# Patient Record
Sex: Female | Born: 1955 | Race: White | Hispanic: No | Marital: Married | Smoking: Never smoker
Health system: Southern US, Community
[De-identification: ages and names within clinical notes are randomized; demographics above are authoritative.]

## PROBLEM LIST (undated history)

## (undated) DIAGNOSIS — R4586 Emotional lability: Secondary | ICD-10-CM

## (undated) DIAGNOSIS — E119 Type 2 diabetes mellitus without complications: Secondary | ICD-10-CM

## (undated) DIAGNOSIS — D649 Anemia, unspecified: Secondary | ICD-10-CM

## (undated) DIAGNOSIS — F329 Major depressive disorder, single episode, unspecified: Secondary | ICD-10-CM

## (undated) DIAGNOSIS — E7800 Pure hypercholesterolemia, unspecified: Secondary | ICD-10-CM

## (undated) DIAGNOSIS — E039 Hypothyroidism, unspecified: Secondary | ICD-10-CM

## (undated) DIAGNOSIS — M1990 Unspecified osteoarthritis, unspecified site: Secondary | ICD-10-CM

## (undated) DIAGNOSIS — M8580 Other specified disorders of bone density and structure, unspecified site: Secondary | ICD-10-CM

## (undated) DIAGNOSIS — F32A Depression, unspecified: Secondary | ICD-10-CM

## (undated) DIAGNOSIS — Z9884 Bariatric surgery status: Secondary | ICD-10-CM

## (undated) HISTORY — PX: HIP FRACTURE SURGERY: SHX118

## (undated) HISTORY — DX: Depression, unspecified: F32.A

## (undated) HISTORY — DX: Bariatric surgery status: Z98.84

## (undated) HISTORY — DX: Pure hypercholesterolemia, unspecified: E78.00

## (undated) HISTORY — DX: Type 2 diabetes mellitus without complications: E11.9

## (undated) HISTORY — DX: Other specified disorders of bone density and structure, unspecified site: M85.80

## (undated) HISTORY — PX: KNEE ARTHROSCOPY WITH ANTERIOR CRUCIATE LIGAMENT (ACL) REPAIR: SHX5644

## (undated) HISTORY — DX: Major depressive disorder, single episode, unspecified: F32.9

## (undated) HISTORY — PX: CHOLECYSTECTOMY: SHX55

## (undated) HISTORY — PX: COLONOSCOPY: SHX174

## (undated) HISTORY — PX: SHOULDER ARTHROSCOPY WITH SUBACROMIAL DECOMPRESSION AND OPEN ROTATOR C: SHX5688

---

## 1998-09-02 ENCOUNTER — Encounter: Payer: Self-pay | Admitting: Orthopedic Surgery

## 1998-09-02 ENCOUNTER — Inpatient Hospital Stay (HOSPITAL_COMMUNITY): Admission: EM | Admit: 1998-09-02 | Discharge: 1998-09-05 | Payer: Self-pay | Admitting: Orthopedic Surgery

## 2000-01-03 ENCOUNTER — Emergency Department (HOSPITAL_COMMUNITY): Admission: EM | Admit: 2000-01-03 | Discharge: 2000-01-03 | Payer: Self-pay | Admitting: Emergency Medicine

## 2000-07-20 HISTORY — PX: GASTRIC BYPASS: SHX52

## 2003-04-20 ENCOUNTER — Encounter: Admission: RE | Admit: 2003-04-20 | Discharge: 2003-04-20 | Payer: Self-pay | Admitting: Family Medicine

## 2003-04-20 ENCOUNTER — Encounter: Payer: Self-pay | Admitting: Family Medicine

## 2003-05-02 ENCOUNTER — Other Ambulatory Visit: Admission: RE | Admit: 2003-05-02 | Discharge: 2003-05-02 | Payer: Self-pay | Admitting: Family Medicine

## 2006-07-20 HISTORY — PX: HAMMER TOE SURGERY: SHX385

## 2006-07-20 HISTORY — PX: OTHER SURGICAL HISTORY: SHX169

## 2006-11-22 ENCOUNTER — Other Ambulatory Visit: Admission: RE | Admit: 2006-11-22 | Discharge: 2006-11-22 | Payer: Self-pay | Admitting: Family Medicine

## 2008-09-20 ENCOUNTER — Other Ambulatory Visit: Admission: RE | Admit: 2008-09-20 | Discharge: 2008-09-20 | Payer: Self-pay | Admitting: Family Medicine

## 2009-07-20 HISTORY — PX: LAPAROSCOPIC GASTRIC BANDING: SHX1100

## 2010-01-21 ENCOUNTER — Other Ambulatory Visit: Admission: RE | Admit: 2010-01-21 | Discharge: 2010-01-21 | Payer: Self-pay | Admitting: Family Medicine

## 2010-07-20 HISTORY — PX: OTHER SURGICAL HISTORY: SHX169

## 2011-09-24 ENCOUNTER — Other Ambulatory Visit: Payer: Self-pay | Admitting: Family Medicine

## 2011-09-24 DIAGNOSIS — G44009 Cluster headache syndrome, unspecified, not intractable: Secondary | ICD-10-CM

## 2011-09-28 ENCOUNTER — Other Ambulatory Visit: Payer: Self-pay | Admitting: Family Medicine

## 2011-09-28 ENCOUNTER — Ambulatory Visit
Admission: RE | Admit: 2011-09-28 | Discharge: 2011-09-28 | Disposition: A | Discharge status: Home / Self Care | Payer: 59 | Source: Ambulatory Visit | Attending: Family Medicine | Admitting: Family Medicine

## 2011-09-28 ENCOUNTER — Inpatient Hospital Stay: Admission: RE | Admit: 2011-09-28 | Payer: 59 | Source: Ambulatory Visit

## 2011-09-28 DIAGNOSIS — G44009 Cluster headache syndrome, unspecified, not intractable: Secondary | ICD-10-CM

## 2011-09-28 MED ORDER — GADOBENATE DIMEGLUMINE 529 MG/ML IV SOLN
15.0000 mL | Freq: Once | INTRAVENOUS | Status: AC | PRN
  Administered 2011-09-28: 15 mL via INTRAVENOUS

## 2013-08-07 ENCOUNTER — Other Ambulatory Visit (HOSPITAL_COMMUNITY)
Admission: RE | Admit: 2013-08-07 | Discharge: 2013-08-07 | Disposition: A | Discharge status: Home / Self Care | Payer: BC Managed Care – PPO | Source: Ambulatory Visit | Attending: Family Medicine | Admitting: Family Medicine

## 2013-08-07 ENCOUNTER — Other Ambulatory Visit: Payer: Self-pay | Admitting: Family Medicine

## 2013-08-07 DIAGNOSIS — Z124 Encounter for screening for malignant neoplasm of cervix: Secondary | ICD-10-CM | POA: Insufficient documentation

## 2013-08-07 DIAGNOSIS — Z1151 Encounter for screening for human papillomavirus (HPV): Secondary | ICD-10-CM | POA: Insufficient documentation

## 2015-06-07 ENCOUNTER — Ambulatory Visit: Payer: Self-pay | Admitting: Physician Assistant

## 2015-06-07 NOTE — H&P (Signed)
TOTAL HIP ADMISSION H&P  Patient is admitted for right total hip arthroplasty.  Subjective:  Chief Complaint: right hip pain  HPI: Renee BogaSherry Guisinger, 59 y.o. female, has a history of pain and functional disability in the right hip(s) due to trauma and patient has failed non-surgical conservative treatments for greater than 12 weeks to include NSAID's and/or analgesics, corticosteriod injections, use of assistive devices and activity modification.  Onset of symptoms was gradual starting 5 years ago with gradually worsening course since that time.The patient noted prior procedures of the hip to include hip fracture canullated pinning on the right hip(s).  Patient currently rates pain in the right hip at 10 out of 10 with activity. Patient has night pain, worsening of pain with activity and weight bearing, trendelenberg gait, pain that interfers with activities of daily living, pain with passive range of motion, crepitus and joint swelling. Patient has evidence of joint space narrowing by imaging studies. This condition presents safety issues increasing the risk of falls.  There is no current active infection.  There are no active problems to display for this patient.  No past medical history on file.  No past surgical history on file.   (Not in a hospital admission) Allergies not on file  Social History  Substance Use Topics  . Smoking status: Not on file  . Smokeless tobacco: Not on file  . Alcohol Use: Not on file    No family history on file.   Review of Systems  Musculoskeletal: Positive for joint pain.  All other systems reviewed and are negative.   Objective:  Physical Exam  Constitutional: She is oriented to person, place, and time. She appears well-developed and well-nourished. No distress.  HENT:  Head: Normocephalic and atraumatic.  Nose: Nose normal.  Eyes: Conjunctivae and EOM are normal. Pupils are equal, round, and reactive to light.  Neck: Normal range of motion. Neck  supple.  Cardiovascular: Normal rate, regular rhythm, normal heart sounds and intact distal pulses.   Respiratory: Effort normal and breath sounds normal. No respiratory distress. She has no wheezes.  GI: Soft. Bowel sounds are normal. She exhibits no distension. There is no tenderness.  Musculoskeletal:       Right hip: She exhibits decreased range of motion, tenderness and bony tenderness.  Lymphadenopathy:    She has no cervical adenopathy.  Neurological: She is alert and oriented to person, place, and time. No cranial nerve deficit.  Skin: Skin is warm and dry. No rash noted. No erythema.  Psychiatric: She has a normal mood and affect. Her behavior is normal.    Vital signs in last 24 hours: @VSRANGES @  Labs:   There is no height or weight on file to calculate BMI.   Imaging Review Plain radiographs demonstrate severe degenerative joint disease of the right hip(s). The bone quality appears to be good for age and reported activity level.  Three canulated screws across femoral neck.  Assessment/Plan:  End stage arthritis, right hip(s)  The patient history, physical examination, clinical judgement of the provider and imaging studies are consistent with end stage degenerative joint disease of the right hip(s) and total hip arthroplasty is deemed medically necessary. The treatment options including medical management, injection therapy, arthroscopy and arthroplasty were discussed at length. The risks and benefits of total hip arthroplasty were presented and reviewed. The risks due to aseptic loosening, infection, stiffness, dislocation/subluxation,  thromboembolic complications and other imponderables were discussed.  The patient acknowledged the explanation, agreed to proceed with the plan  and consent was signed. Patient is being admitted for inpatient treatment for surgery, pain control, PT, OT, prophylactic antibiotics, VTE prophylaxis, progressive ambulation and ADL's and discharge  planning.The patient is planning to be discharged home with home health services

## 2015-06-11 ENCOUNTER — Encounter (HOSPITAL_COMMUNITY): Payer: Self-pay

## 2015-06-11 ENCOUNTER — Encounter (HOSPITAL_COMMUNITY): Payer: Self-pay | Admitting: Emergency Medicine

## 2015-06-11 ENCOUNTER — Ambulatory Visit: Payer: Self-pay | Admitting: Physician Assistant

## 2015-06-11 ENCOUNTER — Encounter (HOSPITAL_COMMUNITY)
Admission: RE | Admit: 2015-06-11 | Discharge: 2015-06-11 | Disposition: A | Discharge status: Home / Self Care | Payer: 59 | Source: Ambulatory Visit | Attending: Orthopedic Surgery | Admitting: Orthopedic Surgery

## 2015-06-11 ENCOUNTER — Ambulatory Visit (HOSPITAL_COMMUNITY): Admission: RE | Admit: 2015-06-11 | Payer: 59 | Source: Ambulatory Visit

## 2015-06-11 ENCOUNTER — Ambulatory Visit (HOSPITAL_COMMUNITY)
Admission: RE | Admit: 2015-06-11 | Discharge: 2015-06-11 | Disposition: A | Discharge status: Home / Self Care | Payer: 59 | Source: Ambulatory Visit | Attending: Physician Assistant | Admitting: Physician Assistant

## 2015-06-11 DIAGNOSIS — Z01818 Encounter for other preprocedural examination: Secondary | ICD-10-CM | POA: Insufficient documentation

## 2015-06-11 DIAGNOSIS — M1611 Unilateral primary osteoarthritis, right hip: Secondary | ICD-10-CM

## 2015-06-11 DIAGNOSIS — Z01810 Encounter for preprocedural cardiovascular examination: Secondary | ICD-10-CM | POA: Insufficient documentation

## 2015-06-11 DIAGNOSIS — Z01812 Encounter for preprocedural laboratory examination: Secondary | ICD-10-CM | POA: Diagnosis not present

## 2015-06-11 HISTORY — DX: Unspecified osteoarthritis, unspecified site: M19.90

## 2015-06-11 HISTORY — DX: Emotional lability: R45.86

## 2015-06-11 HISTORY — DX: Hypothyroidism, unspecified: E03.9

## 2015-06-11 HISTORY — DX: Anemia, unspecified: D64.9

## 2015-06-11 LAB — CBC WITH DIFFERENTIAL/PLATELET
BASOS ABS: 0 10*3/uL (ref 0.0–0.1)
Basophils Relative: 0 %
Eosinophils Absolute: 0.1 10*3/uL (ref 0.0–0.7)
Eosinophils Relative: 1 %
HEMATOCRIT: 29.2 % — AB (ref 36.0–46.0)
HEMOGLOBIN: 7.9 g/dL — AB (ref 12.0–15.0)
LYMPHS PCT: 21 %
Lymphs Abs: 2.2 10*3/uL (ref 0.7–4.0)
MCH: 19 pg — ABNORMAL LOW (ref 26.0–34.0)
MCHC: 27.1 g/dL — ABNORMAL LOW (ref 30.0–36.0)
MCV: 70.4 fL — ABNORMAL LOW (ref 78.0–100.0)
MONOS PCT: 5 %
Monocytes Absolute: 0.5 10*3/uL (ref 0.1–1.0)
Neutro Abs: 7.6 10*3/uL (ref 1.7–7.7)
Neutrophils Relative %: 73 %
Platelets: 482 10*3/uL — ABNORMAL HIGH (ref 150–400)
RBC: 4.15 MIL/uL (ref 3.87–5.11)
RDW: 19.1 % — ABNORMAL HIGH (ref 11.5–15.5)
WBC: 10.4 10*3/uL (ref 4.0–10.5)

## 2015-06-11 LAB — URINALYSIS, ROUTINE W REFLEX MICROSCOPIC
BILIRUBIN URINE: NEGATIVE
Glucose, UA: NEGATIVE mg/dL
Hgb urine dipstick: NEGATIVE
KETONES UR: NEGATIVE mg/dL
NITRITE: NEGATIVE
PROTEIN: NEGATIVE mg/dL
SPECIFIC GRAVITY, URINE: 1.016 (ref 1.005–1.030)
pH: 5.5 (ref 5.0–8.0)

## 2015-06-11 LAB — COMPREHENSIVE METABOLIC PANEL
ALBUMIN: 3.3 g/dL — AB (ref 3.5–5.0)
ALK PHOS: 94 U/L (ref 38–126)
ALT: 21 U/L (ref 14–54)
AST: 27 U/L (ref 15–41)
Anion gap: 5 (ref 5–15)
BILIRUBIN TOTAL: 0.5 mg/dL (ref 0.3–1.2)
CALCIUM: 8.7 mg/dL — AB (ref 8.9–10.3)
CO2: 27 mmol/L (ref 22–32)
Chloride: 108 mmol/L (ref 101–111)
Creatinine, Ser: 0.82 mg/dL (ref 0.44–1.00)
GFR calc Af Amer: >60 mL/min (ref >60)
GFR calc non Af Amer: >60 mL/min (ref >60)
GLUCOSE: 92 mg/dL (ref 65–99)
POTASSIUM: 3.3 mmol/L — AB (ref 3.5–5.1)
Sodium: 140 mmol/L (ref 135–145)
TOTAL PROTEIN: 6.2 g/dL — AB (ref 6.5–8.1)

## 2015-06-11 LAB — TYPE AND SCREEN
ABO/RH(D): O POS
ANTIBODY SCREEN: NEGATIVE

## 2015-06-11 LAB — ABO/RH: ABO/RH(D): O POS

## 2015-06-11 LAB — URINE MICROSCOPIC-ADD ON

## 2015-06-11 LAB — SURGICAL PCR SCREEN
MRSA, PCR: NEGATIVE
STAPHYLOCOCCUS AUREUS: NEGATIVE

## 2015-06-11 LAB — PROTIME-INR
INR: 1.04 (ref 0.00–1.49)
Prothrombin Time: 13.8 seconds (ref 11.6–15.2)

## 2015-06-11 LAB — APTT: APTT: 32 s (ref 24–37)

## 2015-06-11 NOTE — Pre-Procedure Instructions (Addendum)
Renee [name]  06/11/2015     No Pharmacies Listed   Your procedure is scheduled on 06/21/15  [facility] at 530 A.M.  Call this number if you have problems the morning of surgery:  [phone]   Remember:  Do not eat food or drink liquids after midnight.  Take these medicines the morning of surgery with A SIP OF WATER nexium,paxil,tramadol if needed    STOP all herbel meds, nsaids (aleve,naproxen,advil,ibuprofen) 5 days prior to surgery starting 11.27.16 including all vitamins, aspirin   Do not wear jewelry, make-up or nail polish.  Do not wear lotions, powders, or perfumes.  You may wear deodorant.  Do not shave 48 hours prior to surgery.  Men may shave face and neck.  Do not bring valuables to the hospital.  [facility] is not responsible for any belongings or valuables.  Contacts, dentures or bridgework may not be worn into surgery.  Leave your suitcase in the car.  After surgery it may be brought to your room.  For patients admitted to the hospital, discharge time will be determined by your treatment team.  Patients discharged the day of surgery will not be allowed to drive home.   Name and phone number of your driver:    Special instructions:   Special Instructions: [facility] - Preparing for Surgery  Before surgery, you can play an important role.  Because skin is not sterile, your skin needs to be as free of germs as possible.  You can reduce the number of germs on you skin by washing with CHG (chlorahexidine gluconate) soap before surgery.  CHG is an antiseptic cleaner which kills germs and bonds with the skin to continue killing germs even after washing.  Please DO NOT use if you have an allergy to CHG or antibacterial soaps.  If your skin becomes reddened/irritated stop using the CHG and inform your nurse when you arrive at Short Stay.  Do not shave (including legs and underarms) for at least 48 hours prior to the first CHG shower.   You may shave your face.  Please follow these instructions carefully:   1.  Shower with CHG Soap the night before surgery and the morning of Surgery.  2.  If you choose to wash your hair, wash your hair first as usual with your normal shampoo.  3.  After you shampoo, rinse your hair and body thoroughly to remove the Shampoo.  4.  Use CHG as you would any other liquid soap.  You can apply chg directly  to the skin and wash gently with scrungie or a clean washcloth.  5.  Apply the CHG Soap to your body ONLY FROM THE NECK DOWN.  Do not use on open wounds or open sores.  Avoid contact with your eyes ears, mouth and genitals (private parts).  Wash genitals (private parts)       with your normal soap.  6.  Wash thoroughly, paying special attention to the area where your surgery will be performed.  7.  Thoroughly rinse your body with warm water from the neck down.  8.  DO NOT shower/wash with your normal soap after using and rinsing off the CHG Soap.  9.  Pat yourself dry with a clean towel.            10.  Wear clean pajamas.            11.  Place clean sheets on your bed the  night of your first shower and do not sleep with pets.  Day of Surgery  Do not apply any lotions/deodorants the morning of surgery.  Please wear clean clothes to the hospital/surgery center.  Please read over the following fact sheets that you were given. Pain Booklet, Coughing and Deep Breathing, Blood Transfusion Information, Total Joint Packet, MRSA Information and Surgical Site Infection Prevention

## 2015-06-11 NOTE — Progress Notes (Signed)
Dr caffrey office clled with lab results   hgh 7.9, hct 29.2, k+ 3.3. To review and follow up per kelly rn/josh pa

## 2015-06-12 LAB — URINE CULTURE

## 2015-06-19 ENCOUNTER — Telehealth: Payer: Self-pay | Admitting: Hematology

## 2015-06-19 NOTE — Telephone Encounter (Signed)
S/W PT IN REF TO NP APPT ON 06/27/15 @1 :30 REFERRING DR LITTLE DX-ANEMIA

## 2015-06-21 ENCOUNTER — Encounter (HOSPITAL_COMMUNITY): Admission: RE | Payer: Self-pay | Source: Ambulatory Visit

## 2015-06-21 ENCOUNTER — Inpatient Hospital Stay (HOSPITAL_COMMUNITY): Admission: RE | Admit: 2015-06-21 | Payer: 59 | Source: Ambulatory Visit | Admitting: Orthopedic Surgery

## 2015-06-21 SURGERY — ARTHROPLASTY, HIP, TOTAL,POSTERIOR APPROACH
Anesthesia: General | Laterality: Right

## 2015-06-27 ENCOUNTER — Other Ambulatory Visit (HOSPITAL_BASED_OUTPATIENT_CLINIC_OR_DEPARTMENT_OTHER): Payer: 59

## 2015-06-27 ENCOUNTER — Telehealth: Payer: Self-pay | Admitting: Hematology

## 2015-06-27 ENCOUNTER — Ambulatory Visit (HOSPITAL_BASED_OUTPATIENT_CLINIC_OR_DEPARTMENT_OTHER): Payer: 59 | Admitting: Hematology

## 2015-06-27 ENCOUNTER — Encounter: Payer: Self-pay | Admitting: Hematology

## 2015-06-27 VITALS — BP 151/64 | HR 91 | Temp 98.1°F | Resp 18 | Ht 61.0 in | Wt 170.8 lb

## 2015-06-27 DIAGNOSIS — Z903 Acquired absence of stomach [part of]: Secondary | ICD-10-CM

## 2015-06-27 DIAGNOSIS — E559 Vitamin D deficiency, unspecified: Secondary | ICD-10-CM

## 2015-06-27 DIAGNOSIS — Y832 Surgical operation with anastomosis, bypass or graft as the cause of abnormal reaction of the patient, or of later complication, without mention of misadventure at the time of the procedure: Secondary | ICD-10-CM

## 2015-06-27 DIAGNOSIS — K9189 Other postprocedural complications and disorders of digestive system: Secondary | ICD-10-CM

## 2015-06-27 DIAGNOSIS — D509 Iron deficiency anemia, unspecified: Secondary | ICD-10-CM | POA: Diagnosis not present

## 2015-06-27 DIAGNOSIS — E538 Deficiency of other specified B group vitamins: Secondary | ICD-10-CM

## 2015-06-27 DIAGNOSIS — K912 Postsurgical malabsorption, not elsewhere classified: Secondary | ICD-10-CM

## 2015-06-27 LAB — COMPREHENSIVE METABOLIC PANEL
ALBUMIN: 3.8 g/dL (ref 3.5–5.0)
ALK PHOS: 101 U/L (ref 40–150)
ALT: 14 U/L (ref 0–55)
AST: 17 U/L (ref 5–34)
Anion Gap: 14 mEq/L — ABNORMAL HIGH (ref 3–11)
BILIRUBIN TOTAL: 0.4 mg/dL (ref 0.20–1.20)
BUN: 13 mg/dL (ref 7.0–26.0)
CO2: 18 meq/L — AB (ref 22–29)
CREATININE: 0.9 mg/dL (ref 0.6–1.1)
Calcium: 9.5 mg/dL (ref 8.4–10.4)
Chloride: 107 mEq/L (ref 98–109)
EGFR: 73 mL/min/{1.73_m2} — AB (ref >90)
GLUCOSE: 151 mg/dL — AB (ref 70–140)
Potassium: 3.9 mEq/L (ref 3.5–5.1)
SODIUM: 139 meq/L (ref 136–145)
TOTAL PROTEIN: 7.2 g/dL (ref 6.4–8.3)

## 2015-06-27 LAB — CBC & DIFF AND RETIC
BASO%: 0 % (ref 0.0–2.0)
Basophils Absolute: 0 10*3/uL (ref 0.0–0.1)
EOS ABS: 0 10*3/uL (ref 0.0–0.5)
EOS%: 0 % (ref 0.0–7.0)
HCT: 30 % — ABNORMAL LOW (ref 34.8–46.6)
HEMOGLOBIN: 8.7 g/dL — AB (ref 11.6–15.9)
IMMATURE RETIC FRACT: 23 % — AB (ref 1.60–10.00)
LYMPH#: 1.1 10*3/uL (ref 0.9–3.3)
LYMPH%: 5.1 % — AB (ref 14.0–49.7)
MCH: 20 pg — ABNORMAL LOW (ref 25.1–34.0)
MCHC: 29 g/dL — ABNORMAL LOW (ref 31.5–36.0)
MCV: 69.1 fL — AB (ref 79.5–101.0)
MONO#: 0.8 10*3/uL (ref 0.1–0.9)
MONO%: 3.5 % (ref 0.0–14.0)
NEUT%: 91.4 % — ABNORMAL HIGH (ref 38.4–76.8)
NEUTROS ABS: 20.1 10*3/uL — AB (ref 1.5–6.5)
Platelets: 570 10*3/uL — ABNORMAL HIGH (ref 145–400)
RBC: 4.34 10*6/uL (ref 3.70–5.45)
RDW: 20 % — ABNORMAL HIGH (ref 11.2–14.5)
RETIC %: 1.72 % (ref 0.70–2.10)
RETIC CT ABS: 74.65 10*3/uL (ref 33.70–90.70)
WBC: 22 10*3/uL — AB (ref 3.9–10.3)

## 2015-06-27 LAB — CHCC SMEAR

## 2015-06-27 LAB — VITAMIN B12: Vitamin B-12: 726 pg/mL (ref 211–911)

## 2015-06-27 MED ORDER — VITAMIN D (ERGOCALCIFEROL) 1.25 MG (50000 UNIT) PO CAPS: 50000.0000 [IU] | ORAL_CAPSULE | ORAL | Status: DC

## 2015-06-27 MED ORDER — CYANOCOBALAMIN 1000 MCG/ML IJ SOLN: 1000.0000 ug | INTRAMUSCULAR | Status: DC

## 2015-06-27 MED ORDER — B COMPLEX VITAMINS PO CAPS: 1.0000 | ORAL_CAPSULE | Freq: Every day | ORAL | Status: DC

## 2015-06-27 NOTE — Telephone Encounter (Signed)
GAVE AND PRINTED APPT SCHED AND AVS FOR PT FOR DEC AND Vanessa KickjAN

## 2015-06-27 NOTE — Telephone Encounter (Signed)
GAVE AND PRINTED APPT SCHED AND AVS FOR PT FOR dec AND JAN

## 2015-06-28 LAB — IRON AND TIBC
%SAT: 2 % — ABNORMAL LOW (ref 21–57)
IRON: 11 ug/dL — AB (ref 41–142)
TIBC: 576 ug/dL — AB (ref 236–444)
UIBC: 565 ug/dL — ABNORMAL HIGH (ref 120–384)

## 2015-06-28 LAB — FERRITIN: Ferritin: 5 ng/ml — ABNORMAL LOW (ref 9–269)

## 2015-07-01 LAB — VITAMIN C: Vitamin C: 0.8 mg/dL (ref 0.2–1.5)

## 2015-07-01 LAB — FOLATE RBC: RBC Folate: 1057 ng/mL (ref >280)

## 2015-07-01 LAB — VITAMIN D 25 HYDROXY (VIT D DEFICIENCY, FRACTURES): VIT D 25 HYDROXY: 13 ng/mL — AB (ref 30–100)

## 2015-07-01 LAB — VITAMIN A: VITAMIN A (RETINOIC ACID): 15 ug/dL — AB (ref 38–98)

## 2015-07-05 ENCOUNTER — Ambulatory Visit (HOSPITAL_BASED_OUTPATIENT_CLINIC_OR_DEPARTMENT_OTHER): Payer: 59

## 2015-07-05 VITALS — BP 134/62 | HR 84 | Temp 97.5°F | Resp 18

## 2015-07-05 DIAGNOSIS — D509 Iron deficiency anemia, unspecified: Secondary | ICD-10-CM | POA: Diagnosis not present

## 2015-07-05 MED ORDER — FERUMOXYTOL INJECTION 510 MG/17 ML
510.0000 mg | Freq: Once | INTRAVENOUS | Status: AC
  Administered 2015-07-05: 510 mg via INTRAVENOUS
  Filled 2015-07-05: qty 17

## 2015-07-05 MED ORDER — SODIUM CHLORIDE 0.9 % IV SOLN
Freq: Once | INTRAVENOUS | Status: AC
  Administered 2015-07-05: 13:00:00 via INTRAVENOUS

## 2015-07-05 NOTE — Patient Instructions (Signed)

## 2015-07-09 NOTE — Progress Notes (Signed)
[facility]    HEMATOLOGY/ONCOLOGY CONSULTATION NOTE  Date of Service: 07/09/2015  Patient Care Team: Catha Gosselin, MD as PCP - General (Family Medicine)  CHIEF COMPLAINTS/PURPOSE OF CONSULTATION:  Severe iron deficiency Anemia   HISTORY OF PRESENTING ILLNESS:  Renee [name] is a wonderful 59 y.o. female who has been referred to [place] by Dr .Mickie Hillier, MD for evaluation and management of iron deficiency anemia. Patient has a history of hypothyroidism, DM2, HLD, previous morbid obesity (s/p gastric bypass surgery in 2002 and lap banding in 2011 at [place]).  He notes that she has been getting progressively anemic and being feeling very tired, exhausted and has bene having headaches. She notes that tinnitus described as a whoosing sound in her ears, leg cramps and notes that she has had near syncopal episodes about 4 times recently.  She was evaluated by her PCP and noted to have iron deficiency. No evidence of overt GI bleeding or other blood loss. Was evaluated by GI - had an EGD which was unrevealing. Patient notes she has had a colonoscopy in 2008 which was unrevealing. There was concern regarding poor iron absorption orally and given her significant symptoms and pending orthopedics surgery she was referred to [place] for aggressive treatment of her Iron deficiency anemia.  Her labs today showed a Hgb 8.7 with MCV of 69, ferritin level of 5 with 2% iron saturation suggestive very severe iron deficiency. This appears to have been the case despite oral feso4. Patients B12 levels were also low at 147 on 05/27/2015 for which she was started on po B12 replacement by PCP.  She notes she had been vit D deficient despite being on high doses of vit D and wonders what she should do. Notes poor dark vision but no visual field defects.   MEDICAL HISTORY:  Past Medical History  Diagnosis Date  . Hypothyroidism   . Mood swings (HCC)   . Arthritis   . Anemia     hx  . DM type 2 (diabetes mellitus,  type 2) (HCC)   . Hypercholesterolemia   . Gastric bypass status for obesity     h/o gastric bypass surgery followed by lap band  . Osteopenia     on BMD in 07/2012, repeat in 2 yrs    SURGICAL HISTORY: Past Surgical History  Procedure Laterality Date  . Gastric bypass  02  . Cesarean section      31 yrs ago  . Knee arthroscopy with anterior cruciate ligament (acl) repair Right     28 yrs ago  . Hip fracture surgery Right 29 yrs ago    screws  . Shoulder arthroscopy with subacromial decompression and open rotator c Right     28 yrs ago  . Bunions Bilateral 08  . Hammer toe surgery Bilateral 08  . 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 Laparoscopic gastric banding  11  . Cholecystectomy      SOCIAL HISTORY: Social History   Social History  . Marital Status: Married    Spouse Name: N/A  . Number of Children: N/A  . Years of Education: N/A   Occupational History  . Not on file.   Social History Main Topics  . Smoking status: Never Smoker   . Smokeless tobacco: Not on file  . Alcohol Use: No  . Drug Use: No  . Sexual Activity: Not on file   Other Topics Concern  . Not on file   Social History Narrative  FAMILY HISTORY: History reviewed. No pertinent family history.  ALLERGIES:  is allergic to erythromycin.  MEDICATIONS:  Current Outpatient Prescriptions  Medication Sig Dispense Refill  . b complex vitamins capsule Take 1 capsule by mouth daily. 30 capsule 3  . cyanocobalamin (,VITAMIN B-12,) 1000 MCG/ML injection Inject 1 mL (1,000 mcg total) into the skin See admin instructions. Daily for 1 week, weekly for 1 month then monthly 15 mL 0  . estrogen, conjugated,-medroxyprogesterone (PREMPRO) 0.45-1.5 MG tablet Take 1 tablet by mouth daily.    . ferrous sulfate 325 (65 FE) MG tablet Take 325 mg by mouth 3 (three) times daily with meals.    . folic acid (FOLVITE) 800 MCG tablet Take 400 mcg by mouth daily.    Marland Kitchen levothyroxine (SYNTHROID, LEVOTHROID) 200 MCG  tablet Take 200 mcg by mouth daily before breakfast.    . meloxicam (MOBIC) 15 MG tablet TK 1 T PO QD WF  5  . PARoxetine (PAXIL) 20 MG tablet Take 40 mg by mouth daily.    . vitamin B-12 (CYANOCOBALAMIN) 1000 MCG tablet Take 1,000 mcg by mouth 2 (two) times daily.    . Vitamin D, Ergocalciferol, (DRISDOL) 50000 UNITS CAPS capsule Take 1 capsule (50,000 Units total) by mouth 3 (three) times a week. Saturday 36 capsule 0   No current facility-administered medications for this visit.   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  REVIEW OF SYSTEMS:    10 Point review of Systems was done is negative except as noted above.  PHYSICAL EXAMINATION: ECOG PERFORMANCE STATUS: 2 - Symptomatic, <50% confined to bed  . Filed Vitals:   06/27/15 1428  BP: 151/64  Pulse: 91  Temp: 98.1 F (36.7 C)  Resp: 18   Filed Weights   06/27/15 1428  Weight: 170 lb 12.8 oz (77.474 kg)   .Body mass index is 32.29 kg/(m^2).  GENERAL:alert, in no acute distress and comfortable SKIN: skin color, texture, turgor are normal, no rashes or significant lesions EYES: normal, conjunctiva are pink and non-injected, sclera clear OROPHARYNX:no exudate, no erythema and lips, buccal mucosa, and tongue normal  NECK: supple, no JVD, thyroid normal size, non-tender, without nodularity LYMPH:  no palpable lymphadenopathy in the cervical, axillary or inguinal LUNGS: clear to auscultation with normal respiratory effort. HEART: regular rate & rhythm,  no murmurs and no lower extremity edema ABDOMEN: abdomen soft, non-tender, normoactive bowel sounds  Musculoskeletal: no cyanosis of digits and no clubbing  PSYCH: alert & oriented x 3 with fluent speech NEURO: no focal motor/sensory deficits  LABORATORY DATA:  I have reviewed the data as listed  . CBC Latest Ref Rng 06/27/2015 06/11/2015  WBC 3.9 - 10.3 10e3/uL 22.0(H) 10.4  Hemoglobin 11.6 - 15.9 g/dL 4.0(J) 7.9(L)    Hematocrit 34.8 - 46.6 % 30.0(L) 29.2(L)  Platelets 145 - 400 10e3/uL 570(H) 482(H)   . CBC    Component Value Date/Time   WBC 22.0* 06/27/2015 1559   WBC 10.4 06/11/2015 1153   RBC 4.34 06/27/2015 1559   RBC 4.15 06/11/2015 1153   HGB 8.7* 06/27/2015 1559   HGB 7.9* 06/11/2015 1153   HCT 30.0* 06/27/2015 1559   HCT 29.2* 06/11/2015 1153   PLT 570* 06/27/2015 1559   PLT 482* 06/11/2015 1153   MCV 69.1* 06/27/2015 1559   MCV 70.4* 06/11/2015 1153   MCH 20.0* 06/27/2015 1559   MCH 19.0* 06/11/2015 1153   MCHC 29.0* 06/27/2015 1559   MCHC  27.1* 06/11/2015 1153   RDW 20.0* 06/27/2015 1559   RDW 19.1* 06/11/2015 1153   LYMPHSABS 1.1 06/27/2015 1559   LYMPHSABS 2.2 06/11/2015 1153   MONOABS 0.8 06/27/2015 1559   MONOABS 0.5 06/11/2015 1153   EOSABS 0.0 06/27/2015 1559   EOSABS 0.1 06/11/2015 1153   BASOSABS 0.0 06/27/2015 1559   BASOSABS 0.0 06/11/2015 1153     . CMP Latest Ref Rng 06/27/2015 06/11/2015  Glucose 70 - 140 mg/dl 952(W151(H) 92  BUN 7.0 - 41.326.0 mg/dL 24.413.0 <0(N<5(L)  Creatinine 0.6 - 1.1 mg/dL 0.9 0.270.82  Sodium 253136 - 145 mEq/L 139 140  Potassium 3.5 - 5.1 mEq/L 3.9 3.3(L)  Chloride 101 - 111 mmol/L - 108  CO2 22 - 29 mEq/L 18(L) 27  Calcium 8.4 - 10.4 mg/dL 9.5 6.6(Y8.7(L)  Total Protein 6.4 - 8.3 g/dL 7.2 6.2(L)  Total Bilirubin 0.20 - 1.20 mg/dL 4.030.40 0.5  Alkaline Phos 40 - 150 U/L 101 94  AST 5 - 34 U/L 17 27  ALT 0 - 55 U/L 14 21   . Lab Results  Component Value Date   IRON 11* 06/27/2015   TIBC 576* 06/27/2015   IRONPCTSAT 2* 06/27/2015   (Iron and TIBC)  Lab Results  Component Value Date   FERRITIN 5* 06/27/2015      RADIOGRAPHIC STUDIES: I have personally reviewed the radiological images as listed and agreed with the findings in the report. Dg Chest 2 View  06/11/2015  CLINICAL DATA:  Hip replacement. EXAM: CHEST  2 VIEW COMPARISON:  None. FINDINGS: Mediastinum and hilar structures are normal. Lungs are clear. Heart size normal. No pleural  effusion or pneumothorax. Surgical clips in the upper abdomen. IMPRESSION: No acute cardiopulmonary disease. Electronically Signed   By: Maisie Fushomas  Register   On: 06/11/2015 13:55    ASSESSMENT & PLAN:  [id number] caucasian female with h/o gastric bypass surgery and lap banding for weight loss with severe fatigue  1) Microcytic Anemia related to Severe iron deficiency. Ferritin 5 with 2% iron saturation. Patient has had a GI evaluation including EGD which was unrevealing and has not had any evidence of bleeding. Her iron def despite po iron intake is likely due to poor absorption due to her bariatric surgical procedures. Plan -given severity of iron def, failure off po iron and urgency for correction or iron def and anemia given consideration for orthopedic surgery patient was offered IV iron replacement. -after discussing pros and cons patient is agreeable to IV feraheme qweekly x 3 doses. 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  B12 def - given the concern with absorption and the risk of triggering worsening B12 with accelerated erythropoiesis with iron replacement we will recommend [place] B12 replacement Plan -given prescription for B12 daily for a week, weekly for a month and then monthly. Patient will bring her 1st dose to the clinic for education on administration.  Vit D deficiency - persistent despite ergocalciferol once weeky -  -increased to ergocalcferol 3 times weekly given urgency of up coming orthopedic surgery.  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 Earlier if any new concerns.  All of the patients questions were answered with apparent satisfaction. The patient knows to call the clinic with any problems, questions or concerns.  I spent 60 minutes counseling  the patient face to face. The total time spent in the appointment was 70 minutes and more than 50% was on counseling and direct patient cares.    Wyvonnia Lora MD MS AAHIVMS [facility] Hematology/Oncology Physician [facility]  (Office):       [phone] (Work cell):  [phone] (Fax):           [phone]  07/09/2015 4:02 PM

## 2015-07-12 ENCOUNTER — Ambulatory Visit (HOSPITAL_BASED_OUTPATIENT_CLINIC_OR_DEPARTMENT_OTHER): Payer: 59

## 2015-07-12 VITALS — BP 120/51 | HR 76 | Temp 98.6°F | Resp 18

## 2015-07-12 DIAGNOSIS — D509 Iron deficiency anemia, unspecified: Secondary | ICD-10-CM

## 2015-07-12 MED ORDER — SODIUM CHLORIDE 0.9 % IV SOLN
510.0000 mg | Freq: Once | INTRAVENOUS | Status: AC
  Administered 2015-07-12: 510 mg via INTRAVENOUS
  Filled 2015-07-12: qty 17

## 2015-07-12 MED ORDER — SODIUM CHLORIDE 0.9 % IV SOLN
Freq: Once | INTRAVENOUS | Status: AC
  Administered 2015-07-12: 13:00:00 via INTRAVENOUS

## 2015-07-12 NOTE — Progress Notes (Signed)
Pt tolerated feraheme infusion well. Pt monitored 30 minutes post infusion. Pt and VS stable at time of discharge.  

## 2015-07-19 ENCOUNTER — Ambulatory Visit (HOSPITAL_BASED_OUTPATIENT_CLINIC_OR_DEPARTMENT_OTHER): Payer: 59

## 2015-07-19 VITALS — BP 119/60 | HR 75 | Temp 98.7°F | Resp 18

## 2015-07-19 DIAGNOSIS — D509 Iron deficiency anemia, unspecified: Secondary | ICD-10-CM | POA: Diagnosis not present

## 2015-07-19 MED ORDER — SODIUM CHLORIDE 0.9 % IV SOLN
Freq: Once | INTRAVENOUS | Status: AC
  Administered 2015-07-19: 14:00:00 via INTRAVENOUS

## 2015-07-19 MED ORDER — SODIUM CHLORIDE 0.9 % IV SOLN
510.0000 mg | Freq: Once | INTRAVENOUS | Status: AC
  Administered 2015-07-19: 510 mg via INTRAVENOUS
  Filled 2015-07-19: qty 17

## 2015-07-19 NOTE — Patient Instructions (Signed)

## 2015-07-26 ENCOUNTER — Encounter: Payer: Self-pay | Admitting: Hematology

## 2015-07-26 MED ORDER — VITAMIN A-BETA CAROTENE 25000 UNITS PO CAPS: 25000.0000 [IU] | ORAL_CAPSULE | ORAL | Status: DC

## 2015-07-29 ENCOUNTER — Other Ambulatory Visit: Payer: Self-pay | Admitting: *Deleted

## 2015-07-29 ENCOUNTER — Telehealth: Payer: Self-pay | Admitting: *Deleted

## 2015-07-29 MED ORDER — VITAMIN A-BETA CAROTENE 25000 UNITS PO CAPS: 25000.0000 [IU] | ORAL_CAPSULE | ORAL | Status: DC

## 2015-07-29 NOTE — Telephone Encounter (Signed)
Called patient per Dr. Candise CheKale to inform her to pick up Vit A rx from pharmacy due to low Vit A level.  Per pt, pharmacy changed from [facility] to [facility].  This RX resent RX to [facility] per pt.

## 2015-08-07 ENCOUNTER — Other Ambulatory Visit: Payer: Self-pay | Admitting: *Deleted

## 2015-08-07 DIAGNOSIS — D509 Iron deficiency anemia, unspecified: Secondary | ICD-10-CM

## 2015-08-08 ENCOUNTER — Other Ambulatory Visit (HOSPITAL_BASED_OUTPATIENT_CLINIC_OR_DEPARTMENT_OTHER): Payer: 59

## 2015-08-08 ENCOUNTER — Telehealth: Payer: Self-pay | Admitting: Hematology

## 2015-08-08 ENCOUNTER — Ambulatory Visit (HOSPITAL_BASED_OUTPATIENT_CLINIC_OR_DEPARTMENT_OTHER): Payer: 59 | Admitting: Hematology

## 2015-08-08 ENCOUNTER — Encounter: Payer: Self-pay | Admitting: Hematology

## 2015-08-08 VITALS — BP 147/79 | HR 74 | Temp 98.6°F | Resp 18 | Ht 61.0 in | Wt 170.0 lb

## 2015-08-08 DIAGNOSIS — D509 Iron deficiency anemia, unspecified: Secondary | ICD-10-CM

## 2015-08-08 DIAGNOSIS — K912 Postsurgical malabsorption, not elsewhere classified: Secondary | ICD-10-CM

## 2015-08-08 DIAGNOSIS — E538 Deficiency of other specified B group vitamins: Secondary | ICD-10-CM | POA: Diagnosis not present

## 2015-08-08 DIAGNOSIS — E559 Vitamin D deficiency, unspecified: Secondary | ICD-10-CM | POA: Diagnosis not present

## 2015-08-08 DIAGNOSIS — Z903 Acquired absence of stomach [part of]: Secondary | ICD-10-CM

## 2015-08-08 LAB — COMPREHENSIVE METABOLIC PANEL
ALBUMIN: 3.7 g/dL (ref 3.5–5.0)
ALK PHOS: 92 U/L (ref 40–150)
ALT: 23 U/L (ref 0–55)
AST: 24 U/L (ref 5–34)
Anion Gap: 8 mEq/L (ref 3–11)
BUN: 8 mg/dL (ref 7.0–26.0)
CO2: 25 meq/L (ref 22–29)
Calcium: 8.8 mg/dL (ref 8.4–10.4)
Chloride: 107 mEq/L (ref 98–109)
Creatinine: 0.7 mg/dL (ref 0.6–1.1)
EGFR: 88 mL/min/{1.73_m2} — AB (ref >90)
GLUCOSE: 93 mg/dL (ref 70–140)
POTASSIUM: 3.8 meq/L (ref 3.5–5.1)
SODIUM: 140 meq/L (ref 136–145)
Total Bilirubin: 0.34 mg/dL (ref 0.20–1.20)
Total Protein: 6.5 g/dL (ref 6.4–8.3)

## 2015-08-08 LAB — CBC WITH DIFFERENTIAL/PLATELET
BASO%: 0.6 % (ref 0.0–2.0)
Basophils Absolute: 0.1 10*3/uL (ref 0.0–0.1)
EOS%: 0.9 % (ref 0.0–7.0)
Eosinophils Absolute: 0.1 10*3/uL (ref 0.0–0.5)
HEMATOCRIT: 39.6 % (ref 34.8–46.6)
HGB: 12.4 g/dL (ref 11.6–15.9)
LYMPH#: 1.8 10*3/uL (ref 0.9–3.3)
LYMPH%: 18.5 % (ref 14.0–49.7)
MCH: 26.5 pg (ref 25.1–34.0)
MCHC: 31.4 g/dL — AB (ref 31.5–36.0)
MCV: 84.3 fL (ref 79.5–101.0)
MONO#: 0.5 10*3/uL (ref 0.1–0.9)
MONO%: 5 % (ref 0.0–14.0)
NEUT%: 75 % (ref 38.4–76.8)
NEUTROS ABS: 7.3 10*3/uL — AB (ref 1.5–6.5)
Platelets: 276 10*3/uL (ref 145–400)
RBC: 4.7 10*6/uL (ref 3.70–5.45)
RDW: 33.9 % — AB (ref 11.2–14.5)
WBC: 9.7 10*3/uL (ref 3.9–10.3)

## 2015-08-08 LAB — IRON AND TIBC
%SAT: 23 % (ref 21–57)
Iron: 68 ug/dL (ref 41–142)
TIBC: 301 ug/dL (ref 236–444)
UIBC: 233 ug/dL (ref 120–384)

## 2015-08-08 LAB — FERRITIN: Ferritin: 484 ng/ml — ABNORMAL HIGH (ref 9–269)

## 2015-08-08 MED ORDER — B-12 1000 MCG SL SUBL: 1000.0000 ug | SUBLINGUAL_TABLET | Freq: Every day | SUBLINGUAL | Status: DC

## 2015-08-08 MED ORDER — FERROUS SULFATE 325 (65 FE) MG PO TABS: 325.0000 mg | ORAL_TABLET | Freq: Two times a day (BID) | ORAL | Status: DC

## 2015-08-08 MED ORDER — BETA CAROTENE 10000 UNITS PO CAPS: 30000.0000 [IU] | ORAL_CAPSULE | ORAL | Status: DC

## 2015-08-08 NOTE — Telephone Encounter (Signed)
Gv pt appt for 3/15. °

## 2015-08-09 LAB — VITAMIN D 25 HYDROXY (VIT D DEFICIENCY, FRACTURES): Vitamin D, 25-Hydroxy: 16 ng/mL — ABNORMAL LOW (ref 30.0–100.0)

## 2015-08-09 LAB — FOLATE RBC
Folate, Hemolysate: 520.9 ng/mL
Folate, RBC: 1364 ng/mL (ref >498)
Hematocrit: 38.2 % (ref 34.0–46.6)

## 2015-08-09 LAB — VITAMIN B12: Vitamin B12: 1543 pg/mL — ABNORMAL HIGH (ref 211–946)

## 2015-08-10 LAB — VITAMIN A: Vitamin A: 25 ug/dL (ref 20–65)

## 2015-08-12 LAB — VITAMIN C: VITAMIN C: 0.4 mg/dL (ref 0.2–2.0)

## 2015-08-14 NOTE — Progress Notes (Signed)
Marland Kitchen    HEMATOLOGY/ONCOLOGY CLINIC NOTE  Date of Service: .08/08/2015   Patient Care Team: Catha Gosselin, MD as PCP - General (Family Medicine)  CHIEF COMPLAINTS/PURPOSE OF CONSULTATION:  Severe iron deficiency Anemia   HISTORY OF PRESENTING ILLNESS: please see my previous note for details on initial presentation.  INTERVAL HISTORY  Ms. Melman is here for her scheduled followup for iron deficiency anemia.  She notes that she is feeling much better after the IV iron infusions and that her energy levels have significantly improved.  Her hemoglobin has completely normalized.  She also notes better with more aggressive replacement of her nutritional deficiencies that are a result of her gastric bypass surgery.  No further presyncopal episodes.  Notes ongoing significant stress at work related to large amount of work. She notes that she will have to hold off on her hip surgery since she might not get time off from work until May 2017. No other acute new concerns Notes a little bit of a metallic taste in her mouth that could potentially be from her IV iron and should resolve.   MEDICAL HISTORY:  Past Medical History  Diagnosis Date  . Hypothyroidism   . Mood swings (HCC)   . Arthritis   . Anemia     hx  . DM type 2 (diabetes mellitus, type 2) (HCC)   . Hypercholesterolemia   . Gastric bypass status for obesity     h/o gastric bypass surgery followed by lap band  . Osteopenia     on BMD in 07/2012, repeat in 2 yrs    SURGICAL HISTORY: Past Surgical History  Procedure Laterality Date  . Gastric bypass  02  . Cesarean section      31 yrs ago  . Knee arthroscopy with anterior cruciate ligament (acl) repair Right     28 yrs ago  . Hip fracture surgery Right 29 yrs ago    screws  . Shoulder arthroscopy with subacromial decompression and open rotator c Right     28 yrs ago  . Bunions Bilateral 08  . Hammer toe surgery Bilateral 08  . 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  Laparoscopic gastric banding  11  . Cholecystectomy      SOCIAL HISTORY: Social History   Social History  . Marital Status: Married    Spouse Name: N/A  . Number of Children: N/A  . Years of Education: N/A   Occupational History  . Not on file.   Social History Main Topics  . Smoking status: Never Smoker   . Smokeless tobacco: Not on file  . Alcohol Use: No  . Drug Use: No  . Sexual Activity: Not on file   Other Topics Concern  . Not on file   Social History Narrative    FAMILY HISTORY: History reviewed. No pertinent family history.  ALLERGIES:  is allergic to erythromycin.  MEDICATIONS:  Current Outpatient Prescriptions  Medication Sig Dispense Refill  . b complex vitamins capsule Take 1 capsule by mouth daily. 30 capsule 3  . Cyanocobalamin (B-12) 1000 MCG SUBL Place 1,000 mcg under the tongue daily. 30 each 3  . estrogen, conjugated,-medroxyprogesterone (PREMPRO) 0.45-1.5 MG tablet Take 1 tablet by mouth daily.    . ferrous sulfate 325 (65 FE) MG tablet Take 1 tablet (325 mg total) by mouth 2 (two) times daily with a meal.    . folic acid (FOLVITE) 800 MCG tablet Take 400 mcg by mouth daily.    Marland Kitchen  levothyroxine (SYNTHROID, LEVOTHROID) 200 MCG tablet Take 200 mcg by mouth daily before breakfast.    . meloxicam (MOBIC) 15 MG tablet TK 1 T PO QD WF  5  . PARoxetine (PAXIL) 20 MG tablet Take 40 mg by mouth daily.    . vitamin A 16109 UNIT capsule Take 3 capsules (30,000 Units total) by mouth once a week.    . Vitamin D, Ergocalciferol, (DRISDOL) 50000 UNITS CAPS capsule Take 1 capsule (50,000 Units total) by mouth 3 (three) times a week. Saturday 36 capsule 0   No current facility-administered medications for this visit.   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  REVIEW OF SYSTEMS:    10 Point review of Systems was done is negative except as noted above.  PHYSICAL EXAMINATION: ECOG PERFORMANCE STATUS:  1  . Filed Vitals:   08/08/15 1335  BP: 147/79  Pulse: 74  Temp: 98.6 F (37 C)  Resp: 18   Filed Weights   08/08/15 1335  Weight: 170 lb (77.111 kg)   .Body mass index is 32.14 kg/(m^2).  GENERAL:alert, in no acute distress and comfortable SKIN: skin color, texture, turgor are normal, no rashes or significant lesions EYES: normal, conjunctiva are pink and non-injected, sclera clear OROPHARYNX:no exudate, no erythema and lips, buccal mucosa, and tongue normal  NECK: supple, no JVD, thyroid normal size, non-tender, without nodularity LYMPH:  no palpable lymphadenopathy in the cervical, axillary or inguinal LUNGS: clear to auscultation with normal respiratory effort. HEART: regular rate & rhythm,  no murmurs and no lower extremity edema ABDOMEN: abdomen soft, non-tender, normoactive bowel sounds  Musculoskeletal: no cyanosis of digits and no clubbing  PSYCH: alert & oriented x 3 with fluent speech NEURO: no focal motor/sensory deficits  LABORATORY DATA:  I have reviewed the data as listed  . CBC Latest Ref Rng 08/08/2015 08/08/2015 06/27/2015  WBC 3.9 - 10.3 10e3/uL 9.7 - 22.0(H)  Hemoglobin 11.6 - 15.9 g/dL 60.4 - 8.7(L)  Hematocrit 34.0 - 46.6 % 39.6 38.2 30.0(L)  Platelets 145 - 400 10e3/uL 276 - 570(H)   . CBC    Component Value Date/Time   WBC 9.7 08/08/2015 1305   WBC 10.4 06/11/2015 1153   RBC 4.70 08/08/2015 1305   RBC 4.15 06/11/2015 1153   HGB 12.4 08/08/2015 1305   HGB 7.9* 06/11/2015 1153   HCT 39.6 08/08/2015 1305   HCT 38.2 08/08/2015 1305   HCT 29.2* 06/11/2015 1153   PLT 276 08/08/2015 1305   PLT 482* 06/11/2015 1153   MCV 84.3 08/08/2015 1305   MCV 70.4* 06/11/2015 1153   MCH 26.5 08/08/2015 1305   MCH 19.0* 06/11/2015 1153   MCHC 31.4* 08/08/2015 1305   MCHC 27.1* 06/11/2015 1153   RDW 33.9* 08/08/2015 1305   RDW 19.1* 06/11/2015 1153   LYMPHSABS 1.8 08/08/2015 1305   LYMPHSABS 2.2 06/11/2015 1153   MONOABS 0.5 08/08/2015 1305   MONOABS  0.5 06/11/2015 1153   EOSABS 0.1 08/08/2015 1305   EOSABS 0.1 06/11/2015 1153   BASOSABS 0.1 08/08/2015 1305   BASOSABS 0.0 06/11/2015 1153     . CMP Latest Ref Rng 08/08/2015 06/27/2015 06/11/2015  Glucose 70 - 140 mg/dl 93 540(J) 92  BUN 7.0 - 26.0 mg/dL 8.0 81.1 <9(J)  Creatinine 0.6 - 1.1 mg/dL 0.7 0.9 4.78  Sodium 295 - 145 mEq/L 140 139 140  Potassium 3.5 - 5.1 mEq/L 3.8 3.9 3.3(L)  Chloride 101 - 111 mmol/L - -  108  CO2 22 - 29 mEq/L 25 18(L) 27  Calcium 8.4 - 10.4 mg/dL 8.8 9.5 6.2(X)  Total Protein 6.4 - 8.3 g/dL 6.5 7.2 6.2(L)  Total Bilirubin 0.20 - 1.20 mg/dL 5.28 4.13 0.5  Alkaline Phos 40 - 150 U/L 92 101 94  AST 5 - 34 U/L ALT 0 - 55 U/L . Lab Results  Component Value Date   IRON 68 08/08/2015   TIBC 301 08/08/2015   IRONPCTSAT 23 08/08/2015   (Iron and TIBC)  Lab Results  Component Value Date   FERRITIN 484* 08/08/2015   Component     Latest Ref Rng 06/27/2015 08/08/2015  Folate, Hemolysate     Not Estab. ng/mL  520.9  HCT     34.0 - 46.6 %  38.2  Folate, RBC     >498 ng/mL  1,364  Vitamin B-12     211 - 911 pg/mL 726   RBC Folate     >280 ng/mL 1057   Vit D, 25-Hydroxy     30 - 100 ng/mL 13 (L)   Vitamin C     0.2 - 2.0 mg/dL 0.8 0.4  Vitamin A (Retinoic Acid)     38 - 98 mcg/dL 15 (L)   Ferritin     9 - 269 ng/ml 5 (L) 484 (H)  VITAMIN B12     211 - 946 pg/mL  1,543 (H)  Vitamin D, 25-Hydroxy     30.0 - 100.0 ng/mL  16.0 (L)  Vitamin A     20 - 65 ug/dL  25     RADIOGRAPHIC STUDIES: I have personally reviewed the radiological images as listed and agreed with the findings in the report. No results found.  ASSESSMENT & PLAN:  34 caucasian female with h/o gastric bypass surgery and lap banding for weight loss with severe fatigue  1) Microcytic Anemia related to Severe iron deficiency. Ferritin 5 with 2% iron saturation. Patient has had a GI evaluation including EGD which was unrevealing and has not had any  evidence of bleeding. Her iron def despite po iron intake is likely due to poor absorption due to her bariatric surgical procedures. Patient received IV feraheme 510 mg every weekly x3 doses.  However certain levels are much improved and her hemoglobin levels have improved from 7.9 to 12.4.as a result she is feeling much better. Plan -no indication for further IV iron at this time. -We will recheck iron labs and determine need for further placement in 2-3 months.  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  B12 def - given the concern with absorption and the risk of triggering worsening B12 with accelerated erythropoiesis with iron replacement we will recommend [place] B12 replacement She received  B12 daily for a week, weekly for a month and then monthly. Vitamin B12 and folate levels are good at this time Plan -switch to sublingual B12 replacement and oral folate replacement.  3)Vit D deficiency - persistent despite ergocalciferol once weekly. Vitamin D level is gradually improving up to 16 -continue on ergocalcferol 3 times weekly for the additional 2 months after which she will require repeat labs for dose adjustments.  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 Levels have improved to 25 -continue for now with repeat levels in 2 months.  RTC with Dr. Candise Che in 2 months with CBC, CMP,  iron and other nutritional labs.  Continue f/u with pcp  All of the patients questions were answered to her apparent satisfaction. The patient knows to call the clinic with any problems, questions or concerns.  I spent 20 minutes counseling the patient face to face. The total time spent in the appointment was 25 minutes and more than 50% was on counseling and direct patient cares.    Xue Low MD MS AAHIVMS [facility] Hematology/Oncology Physician [facility]  (Office):        [phone] (Work cell):  [phone] (Fax):           [phone]

## 2015-09-19 ENCOUNTER — Ambulatory Visit: Payer: Self-pay | Admitting: Physician Assistant

## 2015-09-19 NOTE — H&P (Signed)
TOTAL HIP ADMISSION H&P  Patient is admitted for right total hip arthroplasty.  Subjective:  Chief Complaint: right hip pain  HPI: Renee [name], 60 y.o. female, has a history of pain and functional disability in the right hip(s) due to trauma and arthritis and patient has failed non-surgical conservative treatments for greater than 12 weeks to include NSAID's and/or analgesics, corticosteriod injections and activity modification.  Onset of symptoms was gradual starting >10 years ago with gradually worsening course since that time.The patient noted prior procedures of the hip to include cannulated pinning on the right hip(s).  Patient currently rates pain in the right hip at 10 out of 10 with activity. Patient has night pain, worsening of pain with activity and weight bearing, trendelenberg gait, pain that interfers with activities of daily living and pain with passive range of motion. Patient has evidence of periarticular osteophytes and joint space narrowing by imaging studies. This condition presents safety issues increasing the risk of falls. There is no current active infection.  Patient Active Problem List   Diagnosis Date Noted  . Iron deficiency anemia 06/27/2015  . B12 deficiency 06/27/2015  . Vitamin D deficiency 06/27/2015  . Complications of gastric bypass surgery 06/27/2015  . 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 Hypothyroidism   . Mood swings (HCC)   . Arthritis   . Anemia     hx  . DM type 2 (diabetes mellitus, type 2) (HCC)   . Hypercholesterolemia   . Gastric bypass status for obesity     h/o gastric bypass surgery followed by lap band  . Osteopenia     on BMD in 07/2012, repeat in 2 yrs    Past Surgical History  Procedure Laterality Date  . Gastric bypass  02  . Cesarean section      31 yrs ago  . Knee arthroscopy with anterior cruciate ligament (acl) repair Right     28 yrs ago  . Hip fracture  surgery Right 29 yrs ago    screws  . Shoulder arthroscopy with subacromial decompression and open rotator c Right     28 yrs ago  . Bunions Bilateral 08  . Hammer toe surgery Bilateral 08  . 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 Laparoscopic gastric banding  11  . Cholecystectomy       (Not in a hospital admission) Allergies  Allergen Reactions  . Erythromycin Palpitations    Social History  Substance Use Topics  . Smoking status: Never Smoker   . Smokeless tobacco: Not on file  . Alcohol Use: No    No family history on file.   Review of Systems  Musculoskeletal: Positive for joint pain.  All other systems reviewed and are negative.   Objective:  Physical Exam  Constitutional: She is oriented to person, place, and time. She appears well-developed and well-nourished. No distress.  HENT:  Head: Normocephalic and atraumatic.  Nose: Nose normal.  Eyes: Conjunctivae and EOM are normal. Pupils are equal, round, and reactive to light.  Neck: Normal range of motion. Neck supple.  Cardiovascular: Normal rate, regular rhythm, normal heart sounds and intact distal pulses.   Respiratory: Effort normal and breath sounds normal. No respiratory distress. She has no wheezes.  GI: Soft. Bowel sounds are normal. She exhibits no distension. There is no tenderness.  Musculoskeletal:       Right hip: She exhibits decreased range of motion, decreased strength and  tenderness.  Lymphadenopathy:    She has no cervical adenopathy.  Neurological: She is alert and oriented to person, place, and time. No cranial nerve deficit.  Skin: Skin is warm and dry. No rash noted. No erythema.  Psychiatric: She has a normal mood and affect. Her behavior is normal.    Vital signs in last 24 hours: @  Labs:   Estimated body mass index is 32.14 kg/(m^2) as calculated from the following:   Height as of 08/08/15:  (1.549 m).   Weight as of 08/08/15: 77.111 kg (170 lb).   Imaging  Review Plain radiographs demonstrate severe degenerative joint disease of the right hip(s). The bone quality appears to be good for age and reported activity level.  Assessment/Plan:  End stage arthritis, right hip(s)  The patient history, physical examination, clinical judgement of the provider and imaging studies are consistent with end stage degenerative joint disease of the right hip(s) and total hip arthroplasty is deemed medically necessary. The treatment options including medical management, injection therapy, arthroscopy and arthroplasty were discussed at length. The risks and benefits of total hip arthroplasty were presented and reviewed. The risks due to aseptic loosening, infection, stiffness, dislocation/subluxation,  thromboembolic complications and other imponderables were discussed.  The patient acknowledged the explanation, agreed to proceed with the plan and consent was signed. Patient is being admitted for inpatient treatment for surgery, pain control, PT, OT, prophylactic antibiotics, VTE prophylaxis, progressive ambulation and ADL's and discharge planning.The patient is planning to be discharged home with home health services

## 2015-09-23 ENCOUNTER — Encounter (HOSPITAL_COMMUNITY): Payer: Self-pay

## 2015-09-23 ENCOUNTER — Encounter (HOSPITAL_COMMUNITY)
Admission: RE | Admit: 2015-09-23 | Discharge: 2015-09-23 | Disposition: A | Discharge status: Home / Self Care | Payer: 59 | Source: Ambulatory Visit | Attending: Orthopedic Surgery | Admitting: Orthopedic Surgery

## 2015-09-23 DIAGNOSIS — Z0183 Encounter for blood typing: Secondary | ICD-10-CM | POA: Insufficient documentation

## 2015-09-23 DIAGNOSIS — M1611 Unilateral primary osteoarthritis, right hip: Secondary | ICD-10-CM | POA: Diagnosis not present

## 2015-09-23 DIAGNOSIS — Z01812 Encounter for preprocedural laboratory examination: Secondary | ICD-10-CM | POA: Diagnosis not present

## 2015-09-23 LAB — CBC WITH DIFFERENTIAL/PLATELET
BASOS PCT: 0 %
Basophils Absolute: 0 10*3/uL (ref 0.0–0.1)
EOS ABS: 0.1 10*3/uL (ref 0.0–0.7)
Eosinophils Relative: 1 %
HEMATOCRIT: 44.6 % (ref 36.0–46.0)
HEMOGLOBIN: 14.5 g/dL (ref 12.0–15.0)
LYMPHS ABS: 2.2 10*3/uL (ref 0.7–4.0)
Lymphocytes Relative: 22 %
MCH: 30.5 pg (ref 26.0–34.0)
MCHC: 32.5 g/dL (ref 30.0–36.0)
MCV: 93.7 fL (ref 78.0–100.0)
Monocytes Absolute: 0.4 10*3/uL (ref 0.1–1.0)
Monocytes Relative: 5 %
NEUTROS ABS: 6.9 10*3/uL (ref 1.7–7.7)
NEUTROS PCT: 72 %
Platelets: 287 10*3/uL (ref 150–400)
RBC: 4.76 MIL/uL (ref 3.87–5.11)
RDW: 19.3 % — ABNORMAL HIGH (ref 11.5–15.5)
WBC: 9.7 10*3/uL (ref 4.0–10.5)

## 2015-09-23 LAB — URINALYSIS, ROUTINE W REFLEX MICROSCOPIC
BILIRUBIN URINE: NEGATIVE
GLUCOSE, UA: NEGATIVE mg/dL
Hgb urine dipstick: NEGATIVE
KETONES UR: NEGATIVE mg/dL
NITRITE: NEGATIVE
PH: 5 (ref 5.0–8.0)
PROTEIN: NEGATIVE mg/dL
Specific Gravity, Urine: 1.022 (ref 1.005–1.030)

## 2015-09-23 LAB — COMPREHENSIVE METABOLIC PANEL
ALBUMIN: 3.9 g/dL (ref 3.5–5.0)
ALK PHOS: 85 U/L (ref 38–126)
ALT: 21 U/L (ref 14–54)
AST: 27 U/L (ref 15–41)
Anion gap: 8 (ref 5–15)
BILIRUBIN TOTAL: 0.6 mg/dL (ref 0.3–1.2)
BUN: 13 mg/dL (ref 6–20)
CALCIUM: 8.9 mg/dL (ref 8.9–10.3)
CO2: 23 mmol/L (ref 22–32)
CREATININE: 0.78 mg/dL (ref 0.44–1.00)
Chloride: 110 mmol/L (ref 101–111)
GFR calc Af Amer: >60 mL/min (ref >60)
GFR calc non Af Amer: >60 mL/min (ref >60)
GLUCOSE: 94 mg/dL (ref 65–99)
Potassium: 3.8 mmol/L (ref 3.5–5.1)
SODIUM: 141 mmol/L (ref 135–145)
Total Protein: 6.4 g/dL — ABNORMAL LOW (ref 6.5–8.1)

## 2015-09-23 LAB — SURGICAL PCR SCREEN
MRSA, PCR: NEGATIVE
Staphylococcus aureus: NEGATIVE

## 2015-09-23 LAB — TYPE AND SCREEN
ABO/RH(D): O POS
ANTIBODY SCREEN: NEGATIVE

## 2015-09-23 LAB — URINE MICROSCOPIC-ADD ON

## 2015-09-23 LAB — PROTIME-INR
INR: 1 (ref 0.00–1.49)
Prothrombin Time: 13.4 seconds (ref 11.6–15.2)

## 2015-09-23 LAB — APTT: aPTT: 32 seconds (ref 24–37)

## 2015-09-23 NOTE — Pre-Procedure Instructions (Signed)
Jodie EchevariaSherry L Nazaire  09/23/2015     Your procedure is scheduled on : Friday October 04, 2015 at 7:30 AM.  [facility] at 5:30 AM.  Call this number if you have problems the morning of surgery: [phone]    Remember:  Do not eat food or drink liquids after midnight.  Take these medicines the morning of surgery with A SIP OF WATER : Levothyroxine (Synthroid), Paroxetine (Paxil)   Stop taking any vitamins, herbal medications/supplements, Meloxicam/Mobic, NSAIDs, Ibuprofen, Advil, Motrin, Aleve, etc on Friday March 10th   Do not wear jewelry, make-up or nail polish.  Do not wear lotions, powders, or perfumes.    Do not shave 48 hours prior to surgery.    Do not bring valuables to the hospital.  [facility] is not responsible for any belongings or valuables.  Contacts, dentures or bridgework may not be worn into surgery.  Leave your suitcase in the car.  After surgery it may be brought to your room.  For patients admitted to the hospital, discharge time will be determined by your treatment team.  Patients discharged the day of surgery will not be allowed to drive home.   Name and phone number of your driver:    Special instructions:  Shower using CHG soap the night before and the morning of your surgery  Please read over the following fact sheets that you were given. Pain Booklet, Coughing and Deep Breathing, Blood Transfusion Information, Total Joint Packet, MRSA Information and Surgical Site Infection Prevention

## 2015-09-23 NOTE — Progress Notes (Signed)
PCP is Kevin Little. Patient denied having any cardiac or pulmonary issues.  

## 2015-09-24 LAB — URINE CULTURE: CULTURE: NO GROWTH

## 2015-10-02 ENCOUNTER — Telehealth: Payer: Self-pay | Admitting: Hematology

## 2015-10-02 ENCOUNTER — Other Ambulatory Visit (HOSPITAL_BASED_OUTPATIENT_CLINIC_OR_DEPARTMENT_OTHER): Payer: 59

## 2015-10-02 ENCOUNTER — Encounter: Payer: Self-pay | Admitting: Hematology

## 2015-10-02 ENCOUNTER — Ambulatory Visit (HOSPITAL_BASED_OUTPATIENT_CLINIC_OR_DEPARTMENT_OTHER): Payer: 59 | Admitting: Hematology

## 2015-10-02 VITALS — BP 140/74 | HR 72 | Temp 98.5°F | Resp 18 | Ht 61.0 in | Wt 170.4 lb

## 2015-10-02 DIAGNOSIS — D509 Iron deficiency anemia, unspecified: Secondary | ICD-10-CM | POA: Diagnosis not present

## 2015-10-02 DIAGNOSIS — E509 Vitamin A deficiency, unspecified: Secondary | ICD-10-CM

## 2015-10-02 DIAGNOSIS — E538 Deficiency of other specified B group vitamins: Secondary | ICD-10-CM | POA: Diagnosis not present

## 2015-10-02 DIAGNOSIS — E559 Vitamin D deficiency, unspecified: Secondary | ICD-10-CM | POA: Diagnosis not present

## 2015-10-02 LAB — COMPREHENSIVE METABOLIC PANEL
ALBUMIN: 3.7 g/dL (ref 3.5–5.0)
ALK PHOS: 99 U/L (ref 40–150)
ALT: 20 U/L (ref 0–55)
ANION GAP: 7 meq/L (ref 3–11)
AST: 19 U/L (ref 5–34)
BILIRUBIN TOTAL: 0.4 mg/dL (ref 0.20–1.20)
BUN: 11.3 mg/dL (ref 7.0–26.0)
CO2: 23 mEq/L (ref 22–29)
CREATININE: 0.8 mg/dL (ref 0.6–1.1)
Calcium: 8.7 mg/dL (ref 8.4–10.4)
Chloride: 111 mEq/L — ABNORMAL HIGH (ref 98–109)
EGFR: 80 mL/min/{1.73_m2} — AB (ref >90)
GLUCOSE: 88 mg/dL (ref 70–140)
Potassium: 3.8 mEq/L (ref 3.5–5.1)
SODIUM: 141 meq/L (ref 136–145)
TOTAL PROTEIN: 6.6 g/dL (ref 6.4–8.3)

## 2015-10-02 LAB — CBC & DIFF AND RETIC
BASO%: 0.3 % (ref 0.0–2.0)
Basophils Absolute: 0 10*3/uL (ref 0.0–0.1)
EOS ABS: 0.1 10*3/uL (ref 0.0–0.5)
EOS%: 1.4 % (ref 0.0–7.0)
HCT: 40.9 % (ref 34.8–46.6)
HEMOGLOBIN: 13.5 g/dL (ref 11.6–15.9)
IMMATURE RETIC FRACT: 5.6 % (ref 1.60–10.00)
LYMPH%: 23.2 % (ref 14.0–49.7)
MCH: 30.8 pg (ref 25.1–34.0)
MCHC: 33 g/dL (ref 31.5–36.0)
MCV: 93.4 fL (ref 79.5–101.0)
MONO#: 0.5 10*3/uL (ref 0.1–0.9)
MONO%: 5 % (ref 0.0–14.0)
NEUT#: 6.8 10*3/uL — ABNORMAL HIGH (ref 1.5–6.5)
NEUT%: 70.1 % (ref 38.4–76.8)
PLATELETS: 289 10*3/uL (ref 145–400)
RBC: 4.38 10*6/uL (ref 3.70–5.45)
RDW: 16.9 % — ABNORMAL HIGH (ref 11.2–14.5)
Retic %: 1.34 % (ref 0.70–2.10)
Retic Ct Abs: 58.69 10*3/uL (ref 33.70–90.70)
WBC: 9.8 10*3/uL (ref 3.9–10.3)
lymph#: 2.3 10*3/uL (ref 0.9–3.3)

## 2015-10-02 NOTE — Telephone Encounter (Signed)
Gave adn printed appt sched and avs for pt for June  °

## 2015-10-03 LAB — VITAMIN B12: Vitamin B12: 1713 pg/mL — ABNORMAL HIGH (ref 211–946)

## 2015-10-03 LAB — VITAMIN D 25 HYDROXY (VIT D DEFICIENCY, FRACTURES): Vitamin D, 25-Hydroxy: 15 ng/mL — ABNORMAL LOW (ref 30.0–100.0)

## 2015-10-03 LAB — FERRITIN: FERRITIN: 230 ng/mL (ref 9–269)

## 2015-10-03 MED ORDER — SODIUM CHLORIDE 0.9 % IV SOLN: INTRAVENOUS | Status: DC

## 2015-10-03 MED ORDER — SODIUM CHLORIDE 0.9 % IV SOLN
1000.0000 mg | INTRAVENOUS | Status: AC
  Administered 2015-10-04: 1000 mg via INTRAVENOUS
  Filled 2015-10-03: qty 10

## 2015-10-03 MED ORDER — VITAMIN D (ERGOCALCIFEROL) 1.25 MG (50000 UNIT) PO CAPS: 50000.0000 [IU] | ORAL_CAPSULE | ORAL | Status: DC

## 2015-10-03 MED ORDER — CEFAZOLIN SODIUM-DEXTROSE 2-3 GM-% IV SOLR
2.0000 g | INTRAVENOUS | Status: AC
  Administered 2015-10-04: 2 g via INTRAVENOUS
  Filled 2015-10-03: qty 50

## 2015-10-03 NOTE — Progress Notes (Signed)
Marland Kitchen    HEMATOLOGY/ONCOLOGY CLINIC NOTE  Date of Service: .   Patient Care Team: Catha Gosselin, MD as PCP - General (Family Medicine)  CHIEF COMPLAINTS/PURPOSE OF CONSULTATION:  Severe iron deficiency Anemia  HISTORY OF PRESENTING ILLNESS: please see my previous note for details on initial presentation.  INTERVAL HISTORY  Renee [name] is here for her scheduled followup for iron deficiency anemia.  She knows a metallic taste in her mouth has resolved. Her energy levels have much improved. She notes that she feels quite good and that this is the best she has felt in a year or more. She has an scheduled for a right total hip arthroplasty on 10/04/2015 and notes that she feels ready for this. Notes no other acute issues. Has been taking ferrous sulfate 1 tablet by mouth 3 times daily. Hemoglobin today is 13.5. No other acute issues.   MEDICAL HISTORY:  Past Medical History  Diagnosis Date  . Hypothyroidism   . Mood swings (HCC)   . Arthritis   . Anemia     hx  . DM type 2 (diabetes mellitus, type 2) (HCC)   . Hypercholesterolemia   . Gastric bypass status for obesity     h/o gastric bypass surgery followed by lap band  . Osteopenia     on BMD in 07/2012, repeat in 2 yrs    SURGICAL HISTORY: Past Surgical History  Procedure Laterality Date  . Gastric bypass  02  . Cesarean section      31 yrs ago  . Knee arthroscopy with anterior cruciate ligament (acl) repair Right     28 yrs ago  . Hip fracture surgery Right 29 yrs ago    screws  . Shoulder arthroscopy with subacromial decompression and open rotator c Right     28 yrs ago  . Bunions Bilateral 08  . Hammer toe surgery Bilateral 08  . 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 Laparoscopic gastric banding  11  . Cholecystectomy    . Colonoscopy      SOCIAL HISTORY: Social History   Social History  . Marital Status: Married    Spouse Name: N/A  . Number of Children: N/A  . Years of Education: N/A   Occupational  History  . Not on file.   Social History Main Topics  . Smoking status: Never Smoker   . Smokeless tobacco: Not on file  . Alcohol Use: No  . Drug Use: No  . Sexual Activity: Not on file   Other Topics Concern  . Not on file   Social History Narrative    FAMILY HISTORY: History reviewed. No pertinent family history.  ALLERGIES:  is allergic to erythromycin.  MEDICATIONS:  Current Outpatient Prescriptions  Medication Sig Dispense Refill  . b complex vitamins capsule Take 1 capsule by mouth daily. 30 capsule 3  . cyanocobalamin (,VITAMIN B-12,) 1000 MCG/ML injection Inject 1,000 mcg into the muscle every 30 (thirty) days.  0  . Cyanocobalamin (B-12) 1000 MCG SUBL Place 1,000 mcg under the tongue daily. 30 each 3  . estrogen, conjugated,-medroxyprogesterone (PREMPRO) 0.45-1.5 MG tablet Take 1 tablet by mouth daily.    . ferrous sulfate 325 (65 FE) MG tablet Take 1 tablet (325 mg total) by mouth 2 (two) times daily with a meal.    . folic acid (FOLVITE) 800 MCG tablet Take 400 mcg by mouth daily.    Marland Kitchen levothyroxine (SYNTHROID, LEVOTHROID) 200 MCG tablet Take 200 mcg by mouth daily  before breakfast.    . meloxicam (MOBIC) 15 MG tablet Take 15 mg by mouth daily  5  . PARoxetine (PAXIL) 20 MG tablet Take 40 mg by mouth daily.    Marland Kitchen Propylene Glycol (SYSTANE BALANCE) 0.6 % SOLN Place 1 drop into both eyes as needed (for dry eyes).    . vitamin A 16109 UNIT capsule Take 3 capsules (30,000 Units total) by mouth once a week.    . Vitamin D, Ergocalciferol, (DRISDOL) 50000 UNITS CAPS capsule Take 1 capsule (50,000 Units total) by mouth 3 (three) times a week. Saturday 36 capsule 0   No current facility-administered medications for this visit.   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  REVIEW OF SYSTEMS:    10 Point review of Systems was done is negative except as noted above.  PHYSICAL EXAMINATION: ECOG PERFORMANCE STATUS:  1  . Filed Vitals:   10/02/15 1455  BP: 140/74  Pulse: 72  Temp: 98.5 F (36.9 C)  Resp: 18   Filed Weights   10/02/15 1455  Weight: 170 lb 6.4 oz (77.293 kg)   .Body mass index is 32.21 kg/(m^2).  GENERAL:alert, in no acute distress and comfortable SKIN: skin color, texture, turgor are normal, no rashes or significant lesions EYES: normal, conjunctiva are pink and non-injected, sclera clear OROPHARYNX:no exudate, no erythema and lips, buccal mucosa, and tongue normal  NECK: supple, no JVD, thyroid normal size, non-tender, without nodularity LYMPH:  no palpable lymphadenopathy in the cervical, axillary or inguinal LUNGS: clear to auscultation with normal respiratory effort. HEART: regular rate & rhythm,  no murmurs and no lower extremity edema ABDOMEN: abdomen soft, non-tender, normoactive bowel sounds  Musculoskeletal: no cyanosis of digits and no clubbing  PSYCH: alert & oriented x 3 with fluent speech NEURO: no focal motor/sensory deficits  LABORATORY DATA:  I have reviewed the data as listed  . CBC Latest Ref Rng 10/02/2015 09/23/2015 08/08/2015  WBC 3.9 - 10.3 10e3/uL 9.8 9.7 9.7  Hemoglobin 11.6 - 15.9 g/dL 60.4 54.0 98.1  Hematocrit 34.8 - 46.6 % 40.9 44.6 39.6  Platelets 145 - 400 10e3/uL 289 287 276   . CBC    Component Value Date/Time   WBC 9.8 10/02/2015 1431   WBC 9.7 09/23/2015 1104   RBC 4.38 10/02/2015 1431   RBC 4.76 09/23/2015 1104   HGB 13.5 10/02/2015 1431   HGB 14.5 09/23/2015 1104   HCT 40.9 10/02/2015 1431   HCT 44.6 09/23/2015 1104   HCT 38.2 08/08/2015 1305   PLT 289 10/02/2015 1431   PLT 287 09/23/2015 1104   MCV 93.4 10/02/2015 1431   MCV 93.7 09/23/2015 1104   MCH 30.8 10/02/2015 1431   MCH 30.5 09/23/2015 1104   MCHC 33.0 10/02/2015 1431   MCHC 32.5 09/23/2015 1104   RDW 16.9* 10/02/2015 1431   RDW 19.3* 09/23/2015 1104   LYMPHSABS 2.3 10/02/2015 1431   LYMPHSABS 2.2 09/23/2015 1104   MONOABS 0.5 10/02/2015 1431   MONOABS 0.4  09/23/2015 1104   EOSABS 0.1 10/02/2015 1431   EOSABS 0.1 09/23/2015 1104   BASOSABS 0.0 10/02/2015 1431   BASOSABS 0.0 09/23/2015 1104     . CMP Latest Ref Rng 10/02/2015 09/23/2015 08/08/2015  Glucose 70 - 140 mg/dl 88 94 93  BUN 7.0 - 19.1 mg/dL 47.8 13 8.0  Creatinine 0.6 - 1.1 mg/dL 0.8 2.95 0.7  Sodium 621 - 145 mEq/L 141 141 140  Potassium 3.5 -  5.1 mEq/L 3.8 3.8 3.8  Chloride 101 - 111 mmol/L - 110 -  CO2 22 - 29 mEq/L 23 23 25   Calcium 8.4 - 10.4 mg/dL 8.7 8.9 8.8  Total Protein 6.4 - 8.3 g/dL 6.6 5.2(W6.4(L) 6.5  Total Bilirubin 0.20 - 1.20 mg/dL 4.130.40 0.6 2.440.34  Alkaline Phos 40 - 150 U/L 99 85 92  AST 5 - 34 U/L 19 27 24   ALT 0 - 55 U/L 20 21 23    . Lab Results  Component Value Date   IRON 68 08/08/2015   TIBC 301 08/08/2015   IRONPCTSAT 23 08/08/2015   (Iron and TIBC)  Lab Results  Component Value Date   FERRITIN 484* 08/08/2015   Component     Latest Ref Rng 10/02/2015  Vitamin D, 25-Hydroxy     30.0 - 100.0 ng/mL 15.0 (L)  Vitamin B12     211 - 946 pg/mL 1,713 (H)      RADIOGRAPHIC STUDIES: I have personally reviewed the radiological images as listed and agreed with the findings in the report. No results found.  ASSESSMENT & PLAN:  [age] caucasian female with h/o gastric bypass surgery and lap banding for weight loss with severe fatigue  1) Microcytic Anemia related to Severe iron deficiency. Ferritin 5 with 2% iron saturation. Patient has had a GI evaluation including EGD which was unrevealing and has not had any evidence of bleeding. Her iron def despite po iron intake is likely due to poor absorption due to her bariatric surgical procedures. Patient received IV feraheme 510 mg every weekly x3 doses.   Ferritin level from today is pending. Her hemoglobin level is much improved and has normalized to 13.5 with normal MCV and normalizing RDW Plan -We will follow up on her ferritin levels from today. -No acute indication for IV iron right now -Continue oral  ferrous sulfate replacement. -We will recheck iron labs in 3 months given increase iron department with blood loss from surgery.  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  B12 def - B12 levels are optimal at this time . Plan -Continue sublingual B12 replacement and oral folate replacement. -If B12 levels remain above 1000 might consider reducing it to 500 micrograms sublingually daily  3)Vit D deficiency - persistent despite ergocalciferol once weekly. Vitamin D level remain persistently low -continue on ergocalcferol 3 times weekly for the additional 3 months  -Given new prescription -could be taken with a fatty meal or olive oil to try to improve absorption.   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 Levels have improved to 25 involvin3 -continue for now with repeat levels in 3 months.  RTC with Dr. Candise CheKale in 2 months with CBC, CMP, iron and other nutritional labs.  Continue f/u with pcp  All of the patients questions were answered to her apparent satisfaction. The patient knows to call the clinic with any problems, questions or concerns.  I spent 15 minutes counseling the patient face to face. The total time spent in the appointment was 20 minutes and more than 50% was on counseling and direct patient cares.    Wyvonnia LoraGautam Kale MD MS AAHIVMS [facility] Hematology/Oncology Physician [facility]  (Office):       [phone] (Work cell):  [phone] (Fax):           [phone]

## 2015-10-03 NOTE — Anesthesia Preprocedure Evaluation (Addendum)
Anesthesia Evaluation  Patient identified by MRN, date of birth, ID band Patient awake    Reviewed: Allergy & Precautions, NPO status , Patient's Chart, lab work & pertinent test results  Airway Mallampati: II  TM Distance: >3 FB Neck ROM: Full    Dental no notable dental hx. (+) Teeth Intact, Dental Advidsory Given   Pulmonary neg pulmonary ROS,    Pulmonary exam normal breath sounds clear to auscultation       Cardiovascular negative cardio ROS Normal cardiovascular exam Rhythm:Regular Rate:Normal     Neuro/Psych negative neurological ROS  negative psych ROS   GI/Hepatic negative GI ROS, Neg liver ROS,   Endo/Other  diabetesHypothyroidism   Renal/GU negative Renal ROS  negative genitourinary   Musculoskeletal negative musculoskeletal ROS (+)   Abdominal   Peds  Hematology negative hematology ROS (+)   Anesthesia Other Findings   Reproductive/Obstetrics negative OB ROS                            Anesthesia Physical Anesthesia Plan  ASA: II  Anesthesia Plan:    Post-op Pain Management:    Induction:   Airway Management Planned:   Additional Equipment:   Intra-op Plan:   Post-operative Plan:   Informed Consent: I have reviewed the patients History and Physical, chart, labs and discussed the procedure including the risks, benefits and alternatives for the proposed anesthesia with the patient or authorized representative who has indicated his/her understanding and acceptance.   Dental advisory given and Dental Advisory Given  Plan Discussed with: CRNA  Anesthesia Plan Comments:        Anesthesia Quick Evaluation

## 2015-10-04 ENCOUNTER — Encounter (HOSPITAL_COMMUNITY)
Admission: RE | Disposition: A | Discharge status: Home Health | Payer: Self-pay | Source: Ambulatory Visit | Attending: Orthopedic Surgery

## 2015-10-04 ENCOUNTER — Encounter (HOSPITAL_COMMUNITY): Payer: Self-pay | Admitting: Anesthesiology

## 2015-10-04 ENCOUNTER — Inpatient Hospital Stay (HOSPITAL_COMMUNITY)
Admission: RE | Admit: 2015-10-04 | Discharge: 2015-10-06 | DRG: 470 | Disposition: A | Discharge status: Home Health | Payer: 59 | Source: Ambulatory Visit | Attending: Orthopedic Surgery | Admitting: Orthopedic Surgery

## 2015-10-04 ENCOUNTER — Inpatient Hospital Stay (HOSPITAL_COMMUNITY): Payer: 59

## 2015-10-04 ENCOUNTER — Inpatient Hospital Stay (HOSPITAL_COMMUNITY): Payer: 59 | Admitting: Anesthesiology

## 2015-10-04 DIAGNOSIS — Z96641 Presence of right artificial hip joint: Secondary | ICD-10-CM

## 2015-10-04 DIAGNOSIS — E039 Hypothyroidism, unspecified: Secondary | ICD-10-CM | POA: Diagnosis present

## 2015-10-04 DIAGNOSIS — M1651 Unilateral post-traumatic osteoarthritis, right hip: Secondary | ICD-10-CM | POA: Diagnosis present

## 2015-10-04 DIAGNOSIS — Z9884 Bariatric surgery status: Secondary | ICD-10-CM

## 2015-10-04 DIAGNOSIS — M1611 Unilateral primary osteoarthritis, right hip: Secondary | ICD-10-CM | POA: Diagnosis present

## 2015-10-04 DIAGNOSIS — M8580 Other specified disorders of bone density and structure, unspecified site: Secondary | ICD-10-CM | POA: Diagnosis present

## 2015-10-04 DIAGNOSIS — E119 Type 2 diabetes mellitus without complications: Secondary | ICD-10-CM | POA: Diagnosis present

## 2015-10-04 DIAGNOSIS — E7800 Pure hypercholesterolemia, unspecified: Secondary | ICD-10-CM | POA: Diagnosis present

## 2015-10-04 DIAGNOSIS — M25551 Pain in right hip: Secondary | ICD-10-CM | POA: Diagnosis present

## 2015-10-04 HISTORY — PX: TOTAL HIP ARTHROPLASTY: SHX124

## 2015-10-04 LAB — CBC
HEMATOCRIT: 33.8 % — AB (ref 36.0–46.0)
Hemoglobin: 10.7 g/dL — ABNORMAL LOW (ref 12.0–15.0)
MCH: 29.8 pg (ref 26.0–34.0)
MCHC: 31.7 g/dL (ref 30.0–36.0)
MCV: 94.2 fL (ref 78.0–100.0)
PLATELETS: 258 10*3/uL (ref 150–400)
RBC: 3.59 MIL/uL — AB (ref 3.87–5.11)
RDW: 16.6 % — ABNORMAL HIGH (ref 11.5–15.5)
WBC: 19.4 10*3/uL — AB (ref 4.0–10.5)

## 2015-10-04 LAB — CREATININE, SERUM
Creatinine, Ser: 0.64 mg/dL (ref 0.44–1.00)
GFR calc non Af Amer: >60 mL/min (ref >60)

## 2015-10-04 LAB — ZINC: ZINC: 53 ug/dL — AB (ref 56–134)

## 2015-10-04 SURGERY — ARTHROPLASTY, HIP, TOTAL,POSTERIOR APPROACH
Anesthesia: General | Site: Hip | Laterality: Right

## 2015-10-04 MED ORDER — PHENOL 1.4 % MT LIQD: 1.0000 | OROMUCOSAL | Status: DC | PRN

## 2015-10-04 MED ORDER — HYDROMORPHONE HCL 1 MG/ML IJ SOLN
0.2500 mg | INTRAMUSCULAR | Status: DC | PRN
  Administered 2015-10-04 (×2): 0.5 mg via INTRAVENOUS

## 2015-10-04 MED ORDER — BUPIVACAINE HCL (PF) 0.5 % IJ SOLN
INTRAMUSCULAR | Status: DC | PRN
  Administered 2015-10-04: 3 mL via INTRATHECAL

## 2015-10-04 MED ORDER — ONDANSETRON HCL 4 MG/2ML IJ SOLN
INTRAMUSCULAR | Status: DC | PRN
  Administered 2015-10-04: 4 mg via INTRAVENOUS

## 2015-10-04 MED ORDER — METOCLOPRAMIDE HCL 5 MG PO TABS: 5.0000 mg | ORAL_TABLET | Freq: Three times a day (TID) | ORAL | Status: DC | PRN

## 2015-10-04 MED ORDER — MEPERIDINE HCL 25 MG/ML IJ SOLN: 6.2500 mg | INTRAMUSCULAR | Status: DC | PRN

## 2015-10-04 MED ORDER — METOCLOPRAMIDE HCL 5 MG/ML IJ SOLN: 5.0000 mg | Freq: Three times a day (TID) | INTRAMUSCULAR | Status: DC | PRN

## 2015-10-04 MED ORDER — ONDANSETRON HCL 4 MG PO TABS: 4.0000 mg | ORAL_TABLET | Freq: Four times a day (QID) | ORAL | Status: DC | PRN

## 2015-10-04 MED ORDER — VITAMIN D (ERGOCALCIFEROL) 1.25 MG (50000 UNIT) PO CAPS
50000.0000 [IU] | ORAL_CAPSULE | ORAL | Status: DC
  Filled 2015-10-04: qty 1

## 2015-10-04 MED ORDER — MENTHOL 3 MG MT LOZG: 1.0000 | LOZENGE | OROMUCOSAL | Status: DC | PRN

## 2015-10-04 MED ORDER — SODIUM CHLORIDE 0.9 % IV SOLN
10.0000 mg | INTRAVENOUS | Status: DC | PRN
  Administered 2015-10-04: 15 ug/min via INTRAVENOUS

## 2015-10-04 MED ORDER — HYDROMORPHONE HCL 1 MG/ML IJ SOLN
INTRAMUSCULAR | Status: AC
  Administered 2015-10-04: 0.5 mg via INTRAVENOUS
  Filled 2015-10-04: qty 1

## 2015-10-04 MED ORDER — CEFAZOLIN SODIUM 1-5 GM-% IV SOLN
1.0000 g | Freq: Four times a day (QID) | INTRAVENOUS | Status: AC
  Administered 2015-10-04 (×2): 1 g via INTRAVENOUS
  Filled 2015-10-04 (×3): qty 50

## 2015-10-04 MED ORDER — POLYETHYLENE GLYCOL 3350 17 G PO PACK: 17.0000 g | PACK | Freq: Every day | ORAL | Status: DC | PRN

## 2015-10-04 MED ORDER — ACETAMINOPHEN 650 MG RE SUPP: 650.0000 mg | Freq: Four times a day (QID) | RECTAL | Status: DC | PRN

## 2015-10-04 MED ORDER — PROMETHAZINE HCL 25 MG/ML IJ SOLN: 6.2500 mg | INTRAMUSCULAR | Status: DC | PRN

## 2015-10-04 MED ORDER — BUPIVACAINE-EPINEPHRINE (PF) 0.5% -1:200000 IJ SOLN
INTRAMUSCULAR | Status: AC
  Filled 2015-10-04: qty 30

## 2015-10-04 MED ORDER — DIPHENHYDRAMINE HCL 12.5 MG/5ML PO ELIX: 12.5000 mg | ORAL_SOLUTION | ORAL | Status: DC | PRN

## 2015-10-04 MED ORDER — PROPOFOL 1000 MG/100ML IV EMUL
INTRAVENOUS | Status: AC
  Filled 2015-10-04: qty 100

## 2015-10-04 MED ORDER — LACTATED RINGERS IV SOLN
INTRAVENOUS | Status: DC | PRN
  Administered 2015-10-04 (×2): via INTRAVENOUS

## 2015-10-04 MED ORDER — SODIUM CHLORIDE 0.9 % IV SOLN
1000.0000 mg | Freq: Once | INTRAVENOUS | Status: AC
  Administered 2015-10-04: 1000 mg via INTRAVENOUS
  Filled 2015-10-04: qty 10

## 2015-10-04 MED ORDER — FENTANYL CITRATE (PF) 100 MCG/2ML IJ SOLN
INTRAMUSCULAR | Status: DC | PRN
  Administered 2015-10-04: 50 ug via INTRAVENOUS

## 2015-10-04 MED ORDER — OXYCODONE HCL 5 MG PO TABS
5.0000 mg | ORAL_TABLET | ORAL | Status: DC | PRN
  Administered 2015-10-04 – 2015-10-06 (×6): 10 mg via ORAL
  Filled 2015-10-04 (×6): qty 2

## 2015-10-04 MED ORDER — PROPOFOL 500 MG/50ML IV EMUL
INTRAVENOUS | Status: DC | PRN
  Administered 2015-10-04: 50 ug/kg/min via INTRAVENOUS

## 2015-10-04 MED ORDER — LIDOCAINE HCL (CARDIAC) 20 MG/ML IV SOLN
INTRAVENOUS | Status: DC | PRN
  Administered 2015-10-04: 50 mg via INTRAVENOUS

## 2015-10-04 MED ORDER — OXYCODONE-ACETAMINOPHEN 5-325 MG PO TABS: 1.0000 | ORAL_TABLET | ORAL | Status: DC | PRN

## 2015-10-04 MED ORDER — BUPIVACAINE-EPINEPHRINE 0.5% -1:200000 IJ SOLN
INTRAMUSCULAR | Status: DC | PRN
  Administered 2015-10-04: 30 mL

## 2015-10-04 MED ORDER — PROPOFOL 500 MG/50ML IV EMUL
INTRAVENOUS | Status: AC
  Filled 2015-10-04: qty 50

## 2015-10-04 MED ORDER — SODIUM CHLORIDE 0.9 % IR SOLN
Status: DC | PRN
  Administered 2015-10-04: 1000 mL

## 2015-10-04 MED ORDER — LEVOTHYROXINE SODIUM 100 MCG PO TABS
200.0000 ug | ORAL_TABLET | Freq: Every day | ORAL | Status: DC
  Administered 2015-10-05 – 2015-10-06 (×2): 200 ug via ORAL
  Filled 2015-10-04 (×2): qty 2

## 2015-10-04 MED ORDER — ACETAMINOPHEN 325 MG PO TABS: 650.0000 mg | ORAL_TABLET | Freq: Four times a day (QID) | ORAL | Status: DC | PRN

## 2015-10-04 MED ORDER — PROPOFOL 10 MG/ML IV BOLUS
INTRAVENOUS | Status: AC
  Filled 2015-10-04: qty 20

## 2015-10-04 MED ORDER — FENTANYL CITRATE (PF) 250 MCG/5ML IJ SOLN
INTRAMUSCULAR | Status: AC
  Filled 2015-10-04: qty 5

## 2015-10-04 MED ORDER — FOLIC ACID 1 MG PO TABS
1.0000 mg | ORAL_TABLET | Freq: Every day | ORAL | Status: DC
  Administered 2015-10-04 – 2015-10-06 (×3): 1 mg via ORAL
  Filled 2015-10-04 (×2): qty 1

## 2015-10-04 MED ORDER — CHLORHEXIDINE GLUCONATE 4 % EX LIQD: 60.0000 mL | Freq: Once | CUTANEOUS | Status: DC

## 2015-10-04 MED ORDER — BISACODYL 5 MG PO TBEC
5.0000 mg | DELAYED_RELEASE_TABLET | Freq: Every day | ORAL | Status: DC | PRN
  Administered 2015-10-04: 5 mg via ORAL
  Filled 2015-10-04: qty 1

## 2015-10-04 MED ORDER — ENOXAPARIN SODIUM 30 MG/0.3ML ~~LOC~~ SOLN: 30.0000 mg | Freq: Two times a day (BID) | SUBCUTANEOUS | Status: DC

## 2015-10-04 MED ORDER — ONDANSETRON HCL 4 MG/2ML IJ SOLN: 4.0000 mg | Freq: Four times a day (QID) | INTRAMUSCULAR | Status: DC | PRN

## 2015-10-04 MED ORDER — FERROUS SULFATE 325 (65 FE) MG PO TABS
325.0000 mg | ORAL_TABLET | Freq: Two times a day (BID) | ORAL | Status: DC
  Administered 2015-10-04 – 2015-10-06 (×4): 325 mg via ORAL
  Filled 2015-10-04 (×4): qty 1

## 2015-10-04 MED ORDER — SODIUM CHLORIDE 0.9 % IV SOLN
INTRAVENOUS | Status: DC
  Administered 2015-10-04: 16:00:00 via INTRAVENOUS

## 2015-10-04 MED ORDER — GLYCOPYRROLATE 0.2 MG/ML IJ SOLN
INTRAMUSCULAR | Status: DC | PRN
  Administered 2015-10-04: .2 mg via INTRAVENOUS

## 2015-10-04 MED ORDER — HYDROMORPHONE HCL 1 MG/ML IJ SOLN
1.0000 mg | INTRAMUSCULAR | Status: DC | PRN
  Administered 2015-10-04 – 2015-10-06 (×7): 1 mg via INTRAVENOUS
  Filled 2015-10-04 (×8): qty 1

## 2015-10-04 MED ORDER — DOCUSATE SODIUM 100 MG PO CAPS
100.0000 mg | ORAL_CAPSULE | Freq: Two times a day (BID) | ORAL | Status: DC
  Administered 2015-10-04 – 2015-10-06 (×4): 100 mg via ORAL
  Filled 2015-10-04 (×4): qty 1

## 2015-10-04 MED ORDER — FLEET ENEMA 7-19 GM/118ML RE ENEM: 1.0000 | ENEMA | Freq: Once | RECTAL | Status: DC | PRN

## 2015-10-04 MED ORDER — PAROXETINE HCL 20 MG PO TABS
40.0000 mg | ORAL_TABLET | Freq: Every day | ORAL | Status: DC
  Administered 2015-10-05 – 2015-10-06 (×2): 40 mg via ORAL
  Filled 2015-10-04 (×2): qty 2

## 2015-10-04 MED ORDER — ENOXAPARIN SODIUM 30 MG/0.3ML ~~LOC~~ SOLN
30.0000 mg | Freq: Two times a day (BID) | SUBCUTANEOUS | Status: DC
  Administered 2015-10-05 – 2015-10-06 (×3): 30 mg via SUBCUTANEOUS
  Filled 2015-10-04 (×3): qty 0.3

## 2015-10-04 MED ORDER — PROPOFOL 10 MG/ML IV BOLUS
INTRAVENOUS | Status: DC | PRN
  Administered 2015-10-04: 100 mg via INTRAVENOUS

## 2015-10-04 MED ORDER — MIDAZOLAM HCL 5 MG/5ML IJ SOLN
INTRAMUSCULAR | Status: DC | PRN
Start: 2015-10-04 — End: 2015-10-04
  Administered 2015-10-04: 2 mg via INTRAVENOUS

## 2015-10-04 MED ORDER — ACETAMINOPHEN 500 MG PO TABS
1000.0000 mg | ORAL_TABLET | Freq: Four times a day (QID) | ORAL | Status: AC
  Administered 2015-10-04 – 2015-10-05 (×4): 1000 mg via ORAL
  Filled 2015-10-04 (×3): qty 2

## 2015-10-04 MED ORDER — MIDAZOLAM HCL 2 MG/2ML IJ SOLN
INTRAMUSCULAR | Status: AC
  Filled 2015-10-04: qty 2

## 2015-10-04 SURGICAL SUPPLY — 54 items
BLADE SAW SAG 73X25 THK (BLADE) ×1
BLADE SAW SGTL 73X25 THK (BLADE) ×1 IMPLANT
BRUSH FEMORAL CANAL (MISCELLANEOUS) IMPLANT
CAPT HIP TOTAL 2 ×2 IMPLANT
COVER SURGICAL LIGHT HANDLE (MISCELLANEOUS) ×2 IMPLANT
DRAPE INCISE IOBAN 66X45 STRL (DRAPES) IMPLANT
DRAPE ORTHO SPLIT 77X108 STRL (DRAPES) ×2
DRAPE SURG ORHT 6 SPLT 77X108 (DRAPES) ×2 IMPLANT
DRAPE U-SHAPE 47X51 STRL (DRAPES) ×2 IMPLANT
DRSG ADAPTIC 3X8 NADH LF (GAUZE/BANDAGES/DRESSINGS) ×2 IMPLANT
DRSG PAD ABDOMINAL 8X10 ST (GAUZE/BANDAGES/DRESSINGS) ×4 IMPLANT
DURAPREP 26ML APPLICATOR (WOUND CARE) ×2 IMPLANT
ELECT BLADE 6.5 EXT (BLADE) IMPLANT
ELECT CAUTERY BLADE 6.4 (BLADE) ×2 IMPLANT
ELECT REM PT RETURN 9FT ADLT (ELECTROSURGICAL) ×2
ELECTRODE REM PT RTRN 9FT ADLT (ELECTROSURGICAL) ×1 IMPLANT
FACESHIELD WRAPAROUND (MASK) ×4 IMPLANT
GAUZE SPONGE 4X4 12PLY STRL (GAUZE/BANDAGES/DRESSINGS) ×2 IMPLANT
GLOVE BIOGEL PI IND STRL 8 (GLOVE) ×2 IMPLANT
GLOVE BIOGEL PI INDICATOR 8 (GLOVE) ×2
GLOVE ORTHO TXT STRL SZ7.5 (GLOVE) ×4 IMPLANT
GLOVE SURG ORTHO 8.0 STRL STRW (GLOVE) ×4 IMPLANT
GOWN STRL REUS W/ TWL LRG LVL3 (GOWN DISPOSABLE) ×1 IMPLANT
GOWN STRL REUS W/ TWL XL LVL3 (GOWN DISPOSABLE) ×1 IMPLANT
GOWN STRL REUS W/TWL 2XL LVL3 (GOWN DISPOSABLE) ×2 IMPLANT
GOWN STRL REUS W/TWL LRG LVL3 (GOWN DISPOSABLE) ×1
GOWN STRL REUS W/TWL XL LVL3 (GOWN DISPOSABLE) ×1
HANDPIECE INTERPULSE COAX TIP (DISPOSABLE)
HOOD PEEL AWAY FACE SHEILD DIS (HOOD) ×2 IMPLANT
IMMOBILIZER KNEE 22 UNIV (SOFTGOODS) ×2 IMPLANT
KIT BASIN OR (CUSTOM PROCEDURE TRAY) ×2 IMPLANT
KIT ROOM TURNOVER OR (KITS) ×2 IMPLANT
MANIFOLD NEPTUNE II (INSTRUMENTS) ×2 IMPLANT
NEEDLE 22X1 1/2 (OR ONLY) (NEEDLE) ×2 IMPLANT
NEEDLE MAYO TROCAR (NEEDLE) IMPLANT
NS IRRIG 1000ML POUR BTL (IV SOLUTION) ×2 IMPLANT
PACK TOTAL JOINT (CUSTOM PROCEDURE TRAY) ×2 IMPLANT
PACK UNIVERSAL I (CUSTOM PROCEDURE TRAY) ×2 IMPLANT
PAD ARMBOARD 7.5X6 YLW CONV (MISCELLANEOUS) ×4 IMPLANT
PRESSURIZER FEMORAL UNIV (MISCELLANEOUS) IMPLANT
SET HNDPC FAN SPRY TIP SCT (DISPOSABLE) IMPLANT
STAPLER VISISTAT 35W (STAPLE) ×4 IMPLANT
SUCTION FRAZIER HANDLE 10FR (MISCELLANEOUS) ×1
SUCTION TUBE FRAZIER 10FR DISP (MISCELLANEOUS) ×1 IMPLANT
SUT ETHIBOND 2 V 37 (SUTURE) ×2 IMPLANT
SUT VIC AB 0 CT1 27 (SUTURE) ×3
SUT VIC AB 0 CT1 27XBRD ANBCTR (SUTURE) ×3 IMPLANT
SUT VIC AB 2-0 CT1 27 (SUTURE) ×2
SUT VIC AB 2-0 CT1 TAPERPNT 27 (SUTURE) ×2 IMPLANT
SYR CONTROL 10ML LL (SYRINGE) ×2 IMPLANT
TOWEL OR 17X24 6PK STRL BLUE (TOWEL DISPOSABLE) ×2 IMPLANT
TOWEL OR 17X26 10 PK STRL BLUE (TOWEL DISPOSABLE) ×2 IMPLANT
TOWER CARTRIDGE SMART MIX (DISPOSABLE) IMPLANT
WATER STERILE IRR 1000ML POUR (IV SOLUTION) ×2 IMPLANT

## 2015-10-04 NOTE — H&P (View-Only) (Signed)
TOTAL HIP ADMISSION H&P  Patient is admitted for right total hip arthroplasty.  Subjective:  Chief Complaint: right hip pain  HPI: Renee [name], 60 y.o. female, has a history of pain and functional disability in the right hip(s) due to trauma and arthritis and patient has failed non-surgical conservative treatments for greater than 12 weeks to include NSAID's and/or analgesics, corticosteriod injections and activity modification.  Onset of symptoms was gradual starting >10 years ago with gradually worsening course since that time.The patient noted prior procedures of the hip to include cannulated pinning on the right hip(s).  Patient currently rates pain in the right hip at 10 out of 10 with activity. Patient has night pain, worsening of pain with activity and weight bearing, trendelenberg gait, pain that interfers with activities of daily living and pain with passive range of motion. Patient has evidence of periarticular osteophytes and joint space narrowing by imaging studies. This condition presents safety issues increasing the risk of falls. There is no current active infection.  Patient Active Problem List   Diagnosis Date Noted  . Iron deficiency anemia 06/27/2015  . B12 deficiency 06/27/2015  . Vitamin D deficiency 06/27/2015  . Complications of gastric bypass surgery 06/27/2015  . 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 Hypothyroidism   . Mood swings (HCC)   . Arthritis   . Anemia     hx  . DM type 2 (diabetes mellitus, type 2) (HCC)   . Hypercholesterolemia   . Gastric bypass status for obesity     h/o gastric bypass surgery followed by lap band  . Osteopenia     on BMD in 07/2012, repeat in 2 yrs    Past Surgical History  Procedure Laterality Date  . Gastric bypass  02  . Cesarean section      31 yrs ago  . Knee arthroscopy with anterior cruciate ligament (acl) repair Right     28 yrs ago  . Hip fracture  surgery Right 29 yrs ago    screws  . Shoulder arthroscopy with subacromial decompression and open rotator c Right     28 yrs ago  . Bunions Bilateral 08  . Hammer toe surgery Bilateral 08  . 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 Laparoscopic gastric banding  11  . Cholecystectomy       (Not in a hospital admission) Allergies  Allergen Reactions  . Erythromycin Palpitations    Social History  Substance Use Topics  . Smoking status: Never Smoker   . Smokeless tobacco: Not on file  . Alcohol Use: No    No family history on file.   Review of Systems  Musculoskeletal: Positive for joint pain.  All other systems reviewed and are negative.   Objective:  Physical Exam  Constitutional: She is oriented to person, place, and time. She appears well-developed and well-nourished. No distress.  HENT:  Head: Normocephalic and atraumatic.  Nose: Nose normal.  Eyes: Conjunctivae and EOM are normal. Pupils are equal, round, and reactive to light.  Neck: Normal range of motion. Neck supple.  Cardiovascular: Normal rate, regular rhythm, normal heart sounds and intact distal pulses.   Respiratory: Effort normal and breath sounds normal. No respiratory distress. She has no wheezes.  GI: Soft. Bowel sounds are normal. She exhibits no distension. There is no tenderness.  Musculoskeletal:       Right hip: She exhibits decreased range of motion, decreased strength and   tenderness.  Lymphadenopathy:    She has no cervical adenopathy.  Neurological: She is alert and oriented to person, place, and time. No cranial nerve deficit.  Skin: Skin is warm and dry. No rash noted. No erythema.  Psychiatric: She has a normal mood and affect. Her behavior is normal.    Vital signs in last 24 hours: @VSRANGES @  Labs:   Estimated body mass index is 32.14 kg/(m^2) as calculated from the following:   Height as of 08/08/15: 5\' 1"  (1.549 m).   Weight as of 08/08/15: 77.111 kg (170 lb).   Imaging  Review Plain radiographs demonstrate severe degenerative joint disease of the right hip(s). The bone quality appears to be good for age and reported activity level.  Assessment/Plan:  End stage arthritis, right hip(s)  The patient history, physical examination, clinical judgement of the provider and imaging studies are consistent with end stage degenerative joint disease of the right hip(s) and total hip arthroplasty is deemed medically necessary. The treatment options including medical management, injection therapy, arthroscopy and arthroplasty were discussed at length. The risks and benefits of total hip arthroplasty were presented and reviewed. The risks due to aseptic loosening, infection, stiffness, dislocation/subluxation,  thromboembolic complications and other imponderables were discussed.  The patient acknowledged the explanation, agreed to proceed with the plan and consent was signed. Patient is being admitted for inpatient treatment for surgery, pain control, PT, OT, prophylactic antibiotics, VTE prophylaxis, progressive ambulation and ADL's and discharge planning.The patient is planning to be discharged home with home health services

## 2015-10-04 NOTE — Brief Op Note (Signed)
10/04/2015  10:52 AM  PATIENT:  Renee EchevariaSherry L [name]  60 y.o. female  PRE-OPERATIVE DIAGNOSIS:  OA RIGHT HIP  POST-OPERATIVE DIAGNOSIS:  OA RIGHT HIP  PROCEDURE:  Procedure(s): TOTAL RIGHT HIP ARTHROPLASTY (Right)  SURGEON:  Surgeon(s) and Role:    * Frederico Hammananiel Caffrey, MD - Primary  PHYSICIAN ASSISTANT: Margart SicklesJoshua Jedi Catalfamo, PA-C  ASSISTANTS:   ANESTHESIA:   local, spinal and general  EBL:  Total I/O In: 2065 [I.V.:1955; IV Piggyback:110] Out: 450 [Blood:450]  BLOOD ADMINISTERED:none  DRAINS: none   LOCAL MEDICATIONS USED:  MARCAINE     SPECIMEN:  No Specimen  DISPOSITION OF SPECIMEN:  N/A  COUNTS:  YES  TOURNIQUET:  * No tourniquets in log *  DICTATION: .Other Dictation: Dictation Number unknown  PLAN OF CARE: Admit to inpatient   PATIENT DISPOSITION:  PACU - hemodynamically stable.   Delay start of Pharmacological VTE agent (>24hrs) due to surgical blood loss or risk of bleeding: yes

## 2015-10-04 NOTE — Transfer of Care (Signed)
Immediate Anesthesia Transfer of Care Note  Patient: Renee [name]  Procedure(s) Performed: Procedure(s): TOTAL RIGHT HIP ARTHROPLASTY (Right)  Patient Location: PACU  Anesthesia Type:General  Level of Consciousness: awake, alert  and oriented  Airway & Oxygen Therapy: Patient Spontanous Breathing and Patient connected to nasal cannula oxygen  Post-op Assessment: Report given to RN, Post -op Vital signs reviewed and stable and Patient moving all extremities X 4  Post vital signs: Reviewed and stable  Last Vitals:  Filed Vitals:   10/04/15 0634  BP: 110/67  Pulse: 63  Temp: 36.8 C  Resp: 18    Complications: No apparent anesthesia complications

## 2015-10-04 NOTE — Interval H&P Note (Signed)
History and Physical Interval Note:  10/04/2015 7:27 AM  Renee EchevariaSherry L Bedingfield  has presented today for surgery, with the diagnosis of OA RIGHT HIP  The various methods of treatment have been discussed with the patient and family. After consideration of risks, benefits and other options for treatment, the patient has consented to  Procedure(s): TOTAL RIGHT HIP ARTHROPLASTY (Right) as a surgical intervention .  The patient's history has been reviewed, patient examined, no change in status, stable for surgery.  I have reviewed the patient's chart and labs.  Questions were answered to the patient's satisfaction.     Phallon Haydu JR,W D

## 2015-10-04 NOTE — Discharge Instructions (Signed)

## 2015-10-04 NOTE — Anesthesia Procedure Notes (Addendum)
Procedure Name: MAC Date/Time: 10/04/2015 7:30 AM Performed by: Neldon Newport Pre-anesthesia Checklist: Patient being monitored, Suction available, Emergency Drugs available, Timeout performed and Patient identified Patient Re-evaluated:Patient Re-evaluated prior to inductionOxygen Delivery Method: Nasal cannula Placement Confirmation: positive ETCO2    Procedure Name: LMA Insertion Date/Time: 10/04/2015 7:56 AM Performed by: Neldon Newport Pre-anesthesia Checklist: Patient being monitored, Suction available, Emergency Drugs available, Patient identified and Timeout performed Patient Re-evaluated:Patient Re-evaluated prior to inductionOxygen Delivery Method: Circle system utilized Preoxygenation: Pre-oxygenation with 100% oxygen Intubation Type: IV induction Ventilation: Mask ventilation without difficulty LMA: LMA inserted LMA Size: 4.0 Number of attempts: 1 Placement Confirmation: positive ETCO2 and breath sounds checked- equal and bilateral Tube secured with: Tape Dental Injury: Teeth and Oropharynx as per pre-operative assessment     Spinal Patient location during procedure: OR Staffing Anesthesiologist: Nolon Nations Performed by: anesthesiologist  Preanesthetic Checklist Completed: patient identified, site marked, surgical consent, pre-op evaluation, timeout performed, IV checked, risks and benefits discussed and monitors and equipment checked Spinal Block Patient position: sitting Prep: ChloraPrep Patient monitoring: heart rate, continuous pulse ox and blood pressure Approach: midline Location: L3-4 Injection technique: single-shot Needle Needle type: Sprotte  Needle gauge: 24 G Needle length: 9 cm Additional Notes Expiration date of kit checked and confirmed. Patient tolerated procedure well, without complications.

## 2015-10-04 NOTE — Op Note (Signed)
NAMLoman Chroman:  [name], Renee               ACCOUNT NO.:  [id number]  MEDICAL RECORD NO.:  [id number]  LOCATION:  [facility]                         FACILITY:  [facility]  PHYSICIAN:  Dyke BrackettW. D. Clarrisa Kaylor, M.D.    DATE OF BIRTH:  [birth year]  DATE OF PROCEDURE:  10/04/2015 DATE OF DISCHARGE:                              OPERATIVE REPORT   PREOPERATIVE DIAGNOSES: 1. Osteoarthritis, right hip. 2. Retained hardware.  POSTOPERATIVE DIAGNOSES: 1. Osteoarthritis, right hip. 2. Retained hardware.  OPERATION:  Right total hip replacement (AML 13.5 mm small stature stem, 1.5 mm neck length with 36 mm ceramic head, 52 mm acetabulum with 10 degree lip liner, with Gription, __________ cancellous screws.  SURGEON:  Dyke BrackettW. D. Louella Medaglia, MD  ASSISTANVincent Peyer:  [place], [place]  BLOOD LOSS:  Approximately __________.  DESCRIPTION OF PROCEDURE:  She initially had a spinal which was not effective and she had a general anesthetic as well.  She was in lateral position with the posterior approach to the hip made.  We incorporated the old incision, extended it into a posterior approach to the subcutaneous tissues.  The fascia lata was scarred on top of the vastus lateralis.  We dissected that off and split the vastus to reveal the screws, 3 titanium screws were removed.  We did a T-capsulotomy on the hip followed by dislocation of the hip, cutting the head about 1 fingerbreadth above the lesser trochanter.  We proceeded to ream and then rasped the femur to accept a 13.5 mm for small statured trial.  Attention was next directed to the acetabulum.  Acetabular retractors including wing retractors were placed.  The acetabulum was cleared of any labral tissue.  We then reamed up to a 50 mm acetabular size for the reaming.  We trialed at the level of 49 to a 50, but really good purchase was not obtained and then we deepened the acetabulum, placed in about 45 degrees of abduction, 10-15 degrees of anteversion, and did basically 2 mm __________  acetabulum.  Final acetabulum was placed.  We then placed 1 screw for extra fixation of the pelvis.  Trial acetabular liner was placed.  We used the trial broach and trialed off that. Excellent reduction was obtained with good parameters, really the hip could not be dislocated, except at extreme flexion, internal rotation, adduction and even then with some difficulty.  Final components were inserted, inserted the final poly with a 10 degree lip liner followed by the femur.  We elected to use a ceramic head based on the patient's young age.  Again all parameters were deemed to be appropriate. Closure was affected with #1 Ethibond on the vastus lateralis.  We removed the screws in a running #1 Ethibond on the fascia lata gluteus maximus fascia followed by 0 and 2-0 Vicryl, skin clips on the skin. Lightly compressive sterile dressing, knee immobilizer applied, taken to recovery room in stable condition.     Dyke BrackettW. D. Remiel Corti, M.D.     WDC/MEDQ  D:  10/04/2015  T:  10/04/2015  Job:  [id number]

## 2015-10-04 NOTE — Anesthesia Postprocedure Evaluation (Signed)
Anesthesia Post Note  Patient: Renee [name]  Procedure(s) Performed: Procedure(s) (LRB): TOTAL RIGHT HIP ARTHROPLASTY (Right)  Patient location during evaluation: PACU Anesthesia Type: General and Spinal Level of consciousness: sedated and patient cooperative Pain management: pain level controlled Vital Signs Assessment: post-procedure vital signs reviewed and stable Respiratory status: spontaneous breathing Cardiovascular status: stable Anesthetic complications: no    Last Vitals:  Filed Vitals:   10/04/15 1137 10/04/15 1152  BP: 104/64 105/63  Pulse: 64 64  Temp:    Resp: 20 19    Last Pain:  Filed Vitals:   10/04/15 1157  PainSc: 7                  Lewie LoronJohn [name]

## 2015-10-05 LAB — BASIC METABOLIC PANEL
ANION GAP: 9 (ref 5–15)
BUN: 7 mg/dL (ref 6–20)
CALCIUM: 7.7 mg/dL — AB (ref 8.9–10.3)
CHLORIDE: 104 mmol/L (ref 101–111)
CO2: 24 mmol/L (ref 22–32)
Creatinine, Ser: 0.73 mg/dL (ref 0.44–1.00)
GFR calc non Af Amer: >60 mL/min (ref >60)
GLUCOSE: 171 mg/dL — AB (ref 65–99)
POTASSIUM: 3.3 mmol/L — AB (ref 3.5–5.1)
Sodium: 137 mmol/L (ref 135–145)

## 2015-10-05 LAB — CBC
HEMATOCRIT: 32.2 % — AB (ref 36.0–46.0)
HEMOGLOBIN: 10.2 g/dL — AB (ref 12.0–15.0)
MCH: 29.8 pg (ref 26.0–34.0)
MCHC: 31.7 g/dL (ref 30.0–36.0)
MCV: 94.2 fL (ref 78.0–100.0)
Platelets: 200 10*3/uL (ref 150–400)
RBC: 3.42 MIL/uL — AB (ref 3.87–5.11)
RDW: 16.5 % — ABNORMAL HIGH (ref 11.5–15.5)
WBC: 8.4 10*3/uL (ref 4.0–10.5)

## 2015-10-05 NOTE — Evaluation (Signed)
Occupational Therapy Evaluation Patient Details Name: Renee EchevariaSherry L [name] MRN: [id number] DOB: 04/17/1956 Today's Date: 10/05/2015    History of Present Illness Pt admitted for elective Right THR (posterior approach).  PMH significant for DM, Gastric Bypass surgery and lap banding, knee surgery, hip fracture surgery, shoulder surgery, and bilateral hammer toe surgery, arthritis, hypothyroidism, anemia, osteopenia, hypercholesterolemia.   Clinical Impression   Pt s/p above. Pt independent with ADLs, PTA. Feel pt will benefit from acute OT to increase independence prior to d/c.     Follow Up Recommendations  No OT follow up;Supervision - Intermittent    Equipment Recommendations  Other (comment) (AE; reports she is getting 3 in 1 and shower chair)    Recommendations for Other Services       Precautions / Restrictions Precautions Precautions: Posterior Hip Precaution Booklet Issued: No Precaution Comments: educated on hip precautions Required Braces or Orthoses: Knee Immobilizer - Right Knee Immobilizer - Right:  (in bed) Restrictions Weight Bearing Restrictions: Yes RLE Weight Bearing: Weight bearing as tolerated      Mobility Bed Mobility Overal bed mobility: Needs Assistance Bed Mobility: Supine to Sit;Sit to Supine     Supine to sit: Mod assist Sit to supine: Min guard   General bed mobility comments: assist with RLE and trunk when coming to sitting position.   Transfers Overall transfer level: Needs assistance Equipment used: Rolling walker (2 wheeled) Transfers: Sit to/from Stand Sit to Stand: Min guard         General transfer comment: cues for technique.    Balance Decreased standing balance. Min guard for ambulation with RW.                        ADL Overall ADL's : Needs assistance/impaired Eating/Feeding: Independent;Bed level                   Lower Body Dressing: Minimal assistance;With adaptive equipment;Sit to/from stand    Toilet Transfer: Min guard;Ambulation;RW (sit to stand from bed)           Functional mobility during ADLs: Min guard;Rolling walker General ADL Comments: Educated on LB dressing technique and AE.  Pt practiced with sock aid and reacher.     Vision     Perception     Praxis      Pertinent Vitals/Pain Pain Assessment: 0-10 Pain Score:  (10.5) Pain Location: RLE Pain Descriptors / Indicators: Grimacing;Moaning Pain Intervention(s): Monitored during session;Repositioned;Limited activity within patient's tolerance     Hand Dominance     Extremity/Trunk Assessment Upper Extremity Assessment Upper Extremity Assessment: Overall WFL for tasks assessed   Lower Extremity Assessment Lower Extremity Assessment: Defer to PT evaluation   Cervical / Trunk Assessment Cervical / Trunk Assessment: Normal   Communication Communication Communication: No difficulties   Cognition Arousal/Alertness: Awake/alert Behavior During Therapy: WFL for tasks assessed/performed Overall Cognitive Status: Within Functional Limits for tasks assessed                     General Comments       Exercises       Shoulder Instructions      Home Living Family/patient expects to be discharged to:: Private residence Living Arrangements: Spouse/significant other Available Help at Discharge: Family Type of Home: House Home Access: Stairs to enter Secretary/administratorntrance Stairs-Number of Steps: 1 Entrance Stairs-Rails: None Home Layout: Two level;Able to live on main level with bedroom/bathroom     Bathroom Shower/Tub: Walk-in shower  Bathroom Toilet:  (elevated toilet)     Home Equipment: Cane - single point;Hand held shower head   Additional Comments: reports she is getting 3 in 1 and shower chair      Prior Functioning/Environment Level of Independence: Independent             OT Diagnosis: Acute pain   OT Problem List: Decreased knowledge of use of DME or AE;Decreased  strength;Impaired balance (sitting and/or standing);Decreased activity tolerance;Decreased range of motion;Pain   OT Treatment/Interventions: Self-care/ADL training;DME and/or AE instruction;Therapeutic activities;Balance training;Patient/family education    OT Goals(Current goals can be found in the care plan section) Acute Rehab OT Goals Patient Stated Goal: not stated OT Goal Formulation: With patient Time For Goal Achievement: 10/12/15 Potential to Achieve Goals: Good ADL Goals Pt Will Perform Lower Body Dressing: with set-up;with supervision;with adaptive equipment;sit to/from stand Pt Will Transfer to Toilet: with supervision;ambulating;with set-up (elevated toilet with grab bar or 3 in 1 over commode) Pt Will Perform Tub/Shower Transfer: Shower transfer;with supervision;with set-up;ambulating;rolling walker;shower seat  OT Frequency: Min 2X/week   Barriers to D/C:            Co-evaluation              End of Session Equipment Utilized During Treatment: Rolling walker;Other (comment) (AE)  Activity Tolerance: Patient limited by pain Patient left: in bed;with call bell/phone within reach   Time: 1610-9604 OT Time Calculation (min): 16 min Charges:  OT General Charges $OT Visit: 1 Procedure OT Evaluation $OT Eval Moderate Complexity: 1 Procedure G-CodesEarlie Raveling OTR/L [phone] 10/05/2015, 2:39 PM

## 2015-10-05 NOTE — Evaluation (Signed)
Physical Therapy Evaluation Patient Details Name: Renee [name] MRN: [id number] DOB: 18-Sep-1955 Today's Date: 10/05/2015   History of Present Illness  Pt admitted for elective Right  THR (posterior approach).  PMH significant for DM, Gastric Bypass surgery and lap banding, multiple orthopedic surgerys 28 years ago.  Clinical Impression  Pt is s/p THA resulting in functional deficits and mobility and transfers.   Pt will benefit from skilled PT to increase their independence and safety with mobility to allow discharge to home with husband.      Follow Up Recommendations Home health PT    Equipment Recommendations  Rolling walker with 5" wheels;3in1 (PT)    Recommendations for Other Services       Precautions / Restrictions Precautions Precautions: Posterior Hip Required Braces or Orthoses: Knee Immobilizer - Right Knee Immobilizer - Right:  (in bed) Restrictions RLE Weight Bearing: Weight bearing as tolerated      Mobility  Bed Mobility Overal bed mobility: Needs Assistance Bed Mobility: Supine to Sit     Supine to sit: Min assist     General bed mobility comments: assist for RLE; curing for sequencing/technique  Transfers Overall transfer level: Needs assistance Equipment used: Rolling walker (2 wheeled) Transfers: Sit to/from Stand Sit to Stand: Min assist         General transfer comment: assist to power up; cueing for sequencing/technique  Ambulation/Gait Ambulation/Gait assistance: Min assist Ambulation Distance (Feet): 10 Feet Assistive device: Rolling walker (2 wheeled) Gait Pattern/deviations: Step-to pattern;Decreased stride length;Antalgic Gait velocity: decreased Gait velocity interpretation: Below normal speed for age/gender General Gait Details: patient got SOB, pale and reported dizziness - returned to recliner immediately.    Stairs            Wheelchair Mobility    Modified Rankin (Stroke Patients Only)       Balance Overall  balance assessment: Needs assistance Sitting-balance support: No upper extremity supported Sitting balance-Leahy Scale: Fair     Standing balance support: Bilateral upper extremity supported Standing balance-Leahy Scale: Poor Standing balance comment: reliant on RW for balance                             Pertinent Vitals/Pain Pain Assessment: 0-10 Pain Score: 4  Pain Location: R hip Pain Descriptors / Indicators: Sore;Discomfort Pain Intervention(s): Limited activity within patient's tolerance;Monitored during session;Premedicated before session    Home Living Family/patient expects to be discharged to:: Private residence Living Arrangements: Spouse/significant other Available Help at Discharge: Family Type of Home: House Home Access: Stairs to enter Entrance Stairs-Rails: None Entrance Stairs-Number of Steps: 1 Home Layout: Two level;Able to live on main level with bedroom/bathroom Home Equipment: Gilmer Mor - single point Additional Comments: reports CM ordered her RW, 3-n-1 and shower seat    Prior Function Level of Independence: Independent               Hand Dominance        Extremity/Trunk Assessment   Upper Extremity Assessment: Defer to OT evaluation           Lower Extremity Assessment: Generalized weakness      Cervical / Trunk Assessment: Normal  Communication   Communication: No difficulties  Cognition Arousal/Alertness: Awake/alert Behavior During Therapy: WFL for tasks assessed/performed Overall Cognitive Status: Within Functional Limits for tasks assessed                      General Comments  Exercises Total Joint Exercises Ankle Circles/Pumps: AROM;Both;10 reps;Seated Quad Sets: AROM;Both;10 reps;Seated      Assessment/Plan    PT Assessment Patient needs continued PT services  PT Diagnosis Generalized weakness;Difficulty walking   PT Problem List Decreased strength;Decreased activity tolerance;Decreased  balance;Decreased mobility;Decreased knowledge of use of DME;Pain;Cardiopulmonary status limiting activity  PT Treatment Interventions DME instruction;Gait training;Stair training;Functional mobility training;Therapeutic activities;Therapeutic exercise;Balance training;Patient/family education   PT Goals (Current goals can be found in the Care Plan section) Acute Rehab PT Goals Patient Stated Goal: get moving PT Goal Formulation: With patient Time For Goal Achievement: 10/12/15 Potential to Achieve Goals: Good    Frequency 7X/week   Barriers to discharge        Co-evaluation               End of Session Equipment Utilized During Treatment: Gait belt Activity Tolerance: Patient limited by fatigue Patient left: in chair;with call bell/phone within reach;with chair alarm set           Time: 1610-96041118-1137 PT Time Calculation (min) (ACUTE ONLY): 19 min   Charges:   PT Evaluation $PT Eval Moderate Complexity: 1 Procedure     PT G CodesOlivia Canter:        Roslin Norwood M 10/05/2015, 12:10 PM  10/05/2015 Corlis HoveMargie Banner Huckaba, PT [phone]

## 2015-10-05 NOTE — Progress Notes (Signed)
Physical Therapy Treatment Patient Details Name: Renee [name] MRN: [id number] DOB: 1955-11-20 Today's Date: 10/05/2015    History of Present Illness Pt admitted for elective Right THR (posterior approach).  PMH significant for DM, Gastric Bypass surgery and lap banding, knee surgery, hip fracture surgery, shoulder surgery, and bilateral hammer toe surgery, arthritis, hypothyroidism, anemia, osteopenia, hypercholesterolemia.    PT Comments    Patient with increased pain this afternoon and deferred out of bed.  Performed ROM/strengthening exercises in bed.  Educated husband and patient on hip precautions.  Tolerated well.  Will need continued to PT to progress mobility.  Anticipate patient will need an extra day before discharge.  Follow Up Recommendations  Home health PT     Equipment Recommendations  Rolling walker with 5" wheels;3in1 (PT)    Recommendations for Other Services       Precautions / Restrictions Precautions Precautions: Posterior Hip Precaution Booklet Issued: No Precaution Comments: educated on hip precautions; educating husband on hip precautions Required Braces or Orthoses: Knee Immobilizer - Right Knee Immobilizer - Right:  (in bed) Restrictions Weight Bearing Restrictions: Yes RLE Weight Bearing: Weight bearing as tolerated    Mobility  Bed Mobility Overal bed mobility: Needs Assistance Bed Mobility: Supine to Sit;Sit to Supine     Supine to sit: Mod assist Sit to supine: Min guard   General bed mobility comments: assist with RLE and trunk when coming to sitting position.   Transfers Overall transfer level: Needs assistance Equipment used: Rolling walker (2 wheeled) Transfers: Sit to/from Stand Sit to Stand: Min guard         General transfer comment: cues for technique.  Ambulation/Gait Ambulation/Gait assistance: Min assist Ambulation Distance (Feet): 10 Feet Assistive device: Rolling walker (2 wheeled) Gait Pattern/deviations: Step-to  pattern;Decreased stride length;Antalgic Gait velocity: decreased Gait velocity interpretation: Below normal speed for age/gender General Gait Details: patient got SOB, pale and reported dizziness - returned to recliner immediately.     Stairs            Wheelchair Mobility    Modified Rankin (Stroke Patients Only)       Balance Overall balance assessment: Needs assistance Sitting-balance support: No upper extremity supported Sitting balance-Leahy Scale: Fair     Standing balance support: Bilateral upper extremity supported Standing balance-Leahy Scale: Poor Standing balance comment: reliant on RW for balance                    Cognition Arousal/Alertness: Awake/alert Behavior During Therapy: WFL for tasks assessed/performed Overall Cognitive Status: Within Functional Limits for tasks assessed                      Exercises Total Joint Exercises Ankle Circles/Pumps: AROM;Both;10 reps;Supine Quad Sets: AROM;Both;10 reps;Supine Gluteal Sets: AROM;Both;10 reps;Supine Short Arc Quad: AROM;10 reps;Supine;Right Heel Slides: AAROM;Right;10 reps;Supine Hip ABduction/ADduction: Limitations Hip Abduction/Adduction Limitations: unable to do secondary to pain     General Comments        Pertinent Vitals/Pain Pain Assessment: 0-10 Pain Score: 4  ("I just got it down") Pain Location: RLE Pain Descriptors / Indicators: Sore;Aching;Cramping;Discomfort Pain Intervention(s): Limited activity within patient's tolerance;Monitored during session    Home Living Family/patient expects to be discharged to:: Private residence Living Arrangements: Spouse/significant other Available Help at Discharge: Family Type of Home: House Home Access: Stairs to enter Entrance Stairs-Rails: None Home Layout: Two level;Able to live on main level with bedroom/bathroom Home Equipment: Cane - single point;Hand held shower head Additional Comments:  reports she is getting 3 in 1  and shower chair    Prior Function Level of Independence: Independent          PT Goals (current goals can now be found in the care plan section) Acute Rehab PT Goals Patient Stated Goal: not stated PT Goal Formulation: With patient Time For Goal Achievement: 10/12/15 Potential to Achieve Goals: Good Progress towards PT goals: Progressing toward goals    Frequency  7X/week    PT Plan Current plan remains appropriate    Co-evaluation             End of Session Equipment Utilized During Treatment: Gait belt Activity Tolerance: Patient tolerated treatment well;No increased pain Patient left: in bed;with call bell/phone within reach;with family/visitor present     Time: [id number] PT Time Calculation (min) (ACUTE ONLY): 10 min  Charges:  $Therapeutic Exercise: 8-22 mins                    G Codes:      Olivia CanterMoton, Hao Dion M 10/05/2015, 3:43 PM  10/05/2015 Corlis HoveMargie Keshayla Schrum, PT [phone]

## 2015-10-05 NOTE — Care Management Note (Signed)
Case Management Note  Patient Details  Name: Renee [name] MRN: [id number] Date of Birth: 08/15/1955  Subjective/Objective:  60 yr old female s/p right total right hip arthroplasty.                  Action/Plan:  Case manager spoke with patient and husband in room concerning home health and DME needs for discharge. [facility]. Patient wants a shower chair, case manager informed her that her insuranve does not cover and she will have to pay $38.00 at time of delivery. She and husband want to purchase it. Case manager placed request in with Trey PaulaJeff, [facility] DME liaison.    Expected Discharge Date:   10/06/15               Expected Discharge Plan:  Home w Home Health Services  In-House Referral:     Discharge planning Services  CM Consult  Post Acute Care Choice:  Durable Medical Equipment, Home Health Choice offered to:  Patient, Spouse  DME Arranged:  3-N-1, Walker rolling, Shower stool DME Agency:  [facility]  HH Arranged:  PT HH [facility]  Status of Service:  Completed, signed off  Medicare Important Message Given:    Date Medicare IM Given:    Medicare IM give by:    Date Additional Medicare IM Given:    Additional Medicare Important Message give by:     If discussed at Long Length of Stay Meetings, dates discussed:    Additional Comments:  Renee [name], Renee Willems Naomi, RN 10/05/2015, 12:18 PM

## 2015-10-06 LAB — CBC
HCT: 29.9 % — ABNORMAL LOW (ref 36.0–46.0)
HEMOGLOBIN: 9.6 g/dL — AB (ref 12.0–15.0)
MCH: 30.5 pg (ref 26.0–34.0)
MCHC: 32.1 g/dL (ref 30.0–36.0)
MCV: 94.9 fL (ref 78.0–100.0)
Platelets: 183 10*3/uL (ref 150–400)
RBC: 3.15 MIL/uL — ABNORMAL LOW (ref 3.87–5.11)
RDW: 16.1 % — ABNORMAL HIGH (ref 11.5–15.5)
WBC: 10.4 10*3/uL (ref 4.0–10.5)

## 2015-10-06 NOTE — Progress Notes (Signed)
Occupational Therapy Treatment Patient Details Name: Renee [name] MRN: [id number] DOB: 12-Jun-1956 Today's Date: 10/06/2015    History of present illness Pt admitted for elective Right THA (posterior approach).  PMH significant for DM, Gastric Bypass surgery and lap banding, knee surgery, hip fracture surgery, shoulder surgery, and bilateral hammer toe surgery, arthritis, hypothyroidism, anemia, osteopenia, hypercholesterolemia.   OT comments  Education provided in session and OT signing off. Feel pt will be safe with spouse available to assist at home.  Follow Up Recommendations  No OT follow up;Supervision - Intermittent    Equipment Recommendations  (reports spouse bought AE; reported she was getting 3 in 1 and shower chair)    Recommendations for Other Services      Precautions / Restrictions Precautions Precautions: Posterior Hip Precaution Booklet Issued: No Precaution Comments: pt able to state 2/3 hip precautions Restrictions Weight Bearing Restrictions: Yes RLE Weight Bearing: Weight bearing as tolerated       Mobility Bed Mobility Overal bed mobility: Needs Assistance Bed Mobility: Supine to Sit     Supine to sit: Supervision     General bed mobility comments: explained to be careful with positioning of LEs.  Transfers Overall transfer level: Needs assistance Equipment used: Rolling walker (2 wheeled) Transfers: Sit to/from Stand Sit to Stand: Min assist         General transfer comment: Pt had spouse hold RW and she pulled up on him. Educated on technique. Min guard-Min assist for sit to stand from bed.     Balance             Pt able to pull underwear up over bottom while standing-OT provided Min guard assist for safety.                 ADL Overall ADL's : Needs assistance/impaired                     Lower Body Dressing: Min guard;Sit to/from stand;With adaptive equipment Lower Body Dressing Details (indicate cue type and  reason): donning panties Toilet Transfer: Minimal assistance;Ambulation;RW (sit to stand from bed-Min guard-Min assist)           Functional mobility during ADLs: Rolling walker (Min guard-Min assist for sit to stand; Supervision-ambulation and set up for RW) General ADL Comments: Pt practiced with reacher to don panties. Verbalized understanding of other AE. Educated on shower transfer techniques. Educated on safety such as safe footwear, etc.      Vision                     Perception     Praxis      Cognition  Awake/Alert-reports she was tired Behavior During Therapy: WFL for tasks assessed/performed Overall Cognitive Status: Within Functional Limits for tasks assessed                       Extremity/Trunk Assessment                  Shoulder Instructions       General Comments      Pertinent Vitals/ Pain       Pain Assessment: No/denies pain (reported soreness but said no pain towards beginning of session)    Home Living  Prior Functioning/Environment              Frequency Min 2X/week     Progress Toward Goals  OT Goals(current goals can now be found in the care plan section)  Progress towards OT goals: Progressing toward goals (Adequate for d/c)  Acute Rehab OT Goals Patient Stated Goal: not stated OT Goal Formulation: With patient Time For Goal Achievement: 10/12/15 Potential to Achieve Goals: Good ADL Goals Pt Will Perform Lower Body Dressing: with set-up;with supervision;with adaptive equipment;sit to/from stand Pt Will Transfer to Toilet: with supervision;ambulating;with set-up (elevated toilet with grab bar or 3 in 1 over commode) Pt Will Perform Tub/Shower Transfer: Shower transfer;with supervision;with set-up;ambulating;rolling walker;shower seat  Plan Discharge plan remains appropriate    Co-evaluation                 End of Session Equipment  Utilized During Treatment: Rolling walker;Other (comment) (AE)   Activity Tolerance Patient limited by fatigue   Patient Left in chair;with call bell/phone within reach;with family/visitor present   Nurse Communication          Time: [id number] OT Time Calculation (min): 18 min  Charges: OT General Charges $OT Visit: 1 Procedure OT Treatments $Self Care/Home Management : 8-22 mins  Earlie RavelingStraub, Ivionna Verley L OTR/L [phone] 10/06/2015, 10:21 AM

## 2015-10-06 NOTE — Progress Notes (Signed)
Physical Therapy Treatment Patient Details Name: Renee [name] MRN: [id number] DOB: 04/22/1956 Today's Date: 10/06/2015    History of Present Illness Pt admitted for elective Right THR (posterior approach).  PMH significant for DM, Gastric Bypass surgery and lap banding, knee surgery, hip fracture surgery, shoulder surgery, and bilateral hammer toe surgery, arthritis, hypothyroidism, anemia, osteopenia, hypercholesterolemia.    PT Comments    Overall making progress with functional mobility and activity tolerance; Cues to self-monitor, though as it is likely her BP went low with the increased activity -- unable to get a BP reading in standing as she became hot and dizzy and needed to sit while we were taking the standing BP post walk; We discussed self-monitoring for activity tolerance, and taking her BPs at home (they have an automatic BP cuff), and discussing this with her HHPT; we also discussed drinking plenty of fluids and eating (not much appetite here);   She has met PT goals for dc, and I have updated Case Mgr and RN; Still, if she is here this afternoon, I am interested in seeing her again for therex  Follow Up Recommendations  Home health PT     Equipment Recommendations  Rolling walker with 5" wheels;3in1 (PT) (notified Case Mgr of likely dc)    Recommendations for Other Services       Precautions / Restrictions Precautions Precautions: Posterior Hip Precaution Comments: educated on hip precautions; educating husband on hip precautions; Patient able to recall precautions and maintains precautions with functional mobility.  Restrictions RLE Weight Bearing: Weight bearing as tolerated    Mobility  Bed Mobility               General bed mobility comments: Reports no difficulty getting OOB  Transfers Overall transfer level: Needs assistance Equipment used: Rolling walker (2 wheeled) Transfers: Sit to/from Stand Sit to Stand: Supervision         General  transfer comment: cues for technique. and to self-monitor for activiyt tolerance  Ambulation/Gait Ambulation/Gait assistance: Min guard Ambulation Distance (Feet): 60 Feet Assistive device: Rolling walker (2 wheeled) Gait Pattern/deviations: Step-through pattern Gait velocity: decreased   General Gait Details: Cues to self-monitor for activity tolerance; at end of walk, attemtped standing BP in front of chair, but pt reported dizziness, and we sat before getting standing BP reading (I posit that it was low) as she requested to sit   Stairs Stairs: Yes Stairs assistance: Supervision Stair Management: No rails;With walker;Step to pattern;Forwards;Backwards Number of Stairs: 1 (2 times) General stair comments: Cues for sequencing  Wheelchair Mobility    Modified Rankin (Stroke Patients Only)       Balance             Standing balance-Leahy Scale: Fair                      Cognition Arousal/Alertness: Awake/alert Behavior During Therapy: WFL for tasks assessed/performed Overall Cognitive Status: Within Functional Limits for tasks assessed                      Exercises Total Joint Exercises Ankle Circles/Pumps:  (Will plan for therex in PM session)    General Comments General comments (skin integrity, edema, etc.): BP initial 111/56; BP reclined after walking 110/59      Pertinent Vitals/Pain Pain Assessment: 0-10 Pain Score: 2  Pain Location: R Hip Pain Descriptors / Indicators: Sore Pain Intervention(s): Monitored during session    Home Living  Prior Function            PT Goals (current goals can now be found in the care plan section) Acute Rehab PT Goals Patient Stated Goal: not stated PT Goal Formulation: With patient Time For Goal Achievement: 10/12/15 Potential to Achieve Goals: Good Progress towards PT goals: Progressing toward goals    Frequency  7X/week    PT Plan Current plan remains  appropriate    Co-evaluation             End of Session   Activity Tolerance: Patient tolerated treatment well;Other (comment) (needing to self-monitor for activity tolerance) Patient left: in chair;with call bell/phone within reach;with family/visitor present     Time: 9672-8979 PT Time Calculation (min) (ACUTE ONLY): 24 min  Charges:  $Gait Training: 23-37 mins                    G Codes:      Quin Hoop 10/06/2015, 9:51 AM  Roney Marion, [place] Pager [phone] Office [phone]

## 2015-10-06 NOTE — Progress Notes (Signed)
Discharge instruction gave to pt and all question answered. Pt is ready to discharge.

## 2015-10-07 ENCOUNTER — Encounter (HOSPITAL_COMMUNITY): Payer: Self-pay | Admitting: Orthopedic Surgery

## 2015-10-28 NOTE — Discharge Summary (Signed)
PATIENT ID: Renee [name]        MRN:  [id number]          DOB/AGE: Oct 28, 1955 / 60 y.o.    DISCHARGE SUMMARY  ADMISSION DATE:    10/04/2015 DISCHARGE DATE:   10/28/2015   ADMISSION DIAGNOSIS: OA RIGHT HIP    DISCHARGE DIAGNOSIS:  OA RIGHT HIP    ADDITIONAL DIAGNOSIS: Active Problems:   Post-traumatic osteoarthritis of right hip  Past Medical History  Diagnosis Date  . Hypothyroidism   . Mood swings (HCC)   . Arthritis   . Anemia     hx  . DM type 2 (diabetes mellitus, type 2) (HCC)   . Hypercholesterolemia   . Gastric bypass status for obesity     h/o gastric bypass surgery followed by lap band  . Osteopenia     on BMD in 07/2012, repeat in 2 yrs    PROCEDURE: Procedure(s): TOTAL RIGHT HIP ARTHROPLASTY Right on 10/04/2015  CONSULTS: PT/OT      HISTORY:  See H&P in chart  HOSPITAL COURSE:  Renee [name] is a 60 y.o. admitted on 10/04/2015 and found to have a diagnosis of OA RIGHT HIP.  After appropriate laboratory studies were obtained  they were taken to the operating room on 10/04/2015 and underwent  Procedure(s): TOTAL RIGHT HIP ARTHROPLASTY  Right.   They were given perioperative antibiotics:  Anti-infectives    Start     Dose/Rate Route Frequency Ordered Stop   10/04/15 1300  ceFAZolin (ANCEF) IVPB 1 g/50 mL premix     1 g 100 mL/hr over 30 Minutes Intravenous Every 6 hours 10/04/15 1251 10/04/15 1921   10/04/15 0700  ceFAZolin (ANCEF) IVPB 2 g/50 mL premix     2 g 100 mL/hr over 30 Minutes Intravenous To [facility]Stay [facility] 10/03/15 0933 10/04/15 0745    .  Tolerated the procedure well.   POD #1, allowed out of bed to a chair.  PT for ambulation and exercise program.   IV saline locked.  O2 discontionued.  POD #2, continued PT and ambulation.   .  The remainder of the hospital course was dedicated to ambulation and strengthening.   The patient was discharged on 2 days post op in  Stable condition.  Blood products given:none  DIAGNOSTIC  STUDIES: Recent vital signs: No data found.      Recent laboratory studies: No results for input(s): WBC, HGB, HCT, PLT in the last 168 hours. No results for input(s): NA, K, CL, CO2, BUN, CREATININE, GLUCOSE, CALCIUM in the last 168 hours. Lab Results  Component Value Date   INR 1.00 09/23/2015   INR 1.04 06/11/2015     Recent Radiographic Studies :  Dg Hip Port Unilat With Pelvis 1v Right  10/04/2015  CLINICAL DATA:  Post right total hip replacement EXAM: DG HIP (WITH OR WITHOUT PELVIS) 1V PORT RIGHT COMPARISON:  None. FINDINGS: Changes of right hip replacement. Normal alignment. No hardware or bony complicating feature. Soft tissue gas noted. IMPRESSION: Right hip replacement.  No complicating feature. Electronically Signed   By: Charlett Nose M.D.   On: 10/04/2015 11:46    DISCHARGE INSTRUCTIONS:   DISCHARGE MEDICATIONS:     Medication List    STOP taking these medications        estrogen (conjugated)-medroxyprogesterone 0.45-1.5 MG tablet  Commonly known as:  PREMPRO     meloxicam 15 MG tablet  Commonly known as:  MOBIC      TAKE these  medications        b complex vitamins capsule  Take 1 capsule by mouth daily.     beta carotene [id number] UNIT capsule  Take 3 capsules (30,000 Units total) by mouth once a week.     cyanocobalamin 1000 MCG/ML injection  Commonly known as:  (VITAMIN B-12)  Inject 1,000 mcg into the muscle every 30 (thirty) days.     enoxaparin 30 MG/0.3ML injection  Commonly known as:  LOVENOX  Inject 0.3 mLs (30 mg total) into the skin every 12 (twelve) hours.     ferrous sulfate 325 (65 FE) MG tablet  Take 1 tablet (325 mg total) by mouth 2 (two) times daily with a meal.     folic acid 800 MCG tablet  Commonly known as:  FOLVITE  Take 400 mcg by mouth daily.     levothyroxine 200 MCG tablet  Commonly known as:  SYNTHROID, LEVOTHROID  Take 200 mcg by mouth daily before breakfast.     oxyCODONE-acetaminophen 5-325 MG tablet  Commonly known  as:  ROXICET  Take 1-2 tablets by mouth every 4 (four) hours as needed for moderate pain or severe pain.     PARoxetine 20 MG tablet  Commonly known as:  PAXIL  Take 40 mg by mouth daily.     SYSTANE BALANCE 0.6 % Soln  Generic drug:  Propylene Glycol  Place 1 drop into both eyes as needed (for dry eyes).     Vitamin D (Ergocalciferol) 50000 units Caps capsule  Commonly known as:  DRISDOL  Take 1 capsule (50,000 Units total) by mouth 3 (three) times a week. Saturday        FOLLOW UP VISIT:       Follow-up Information    Follow up with CAFFREY JR,W D, MD. Schedule an appointment as soon as possible for a visit in 2 weeks.   Specialty:  Orthopedic Surgery   Contact information:   [address] [place] [address] [phone]       DISPOSITION:   Home  CONDITION:  Stable   Margart SicklesJoshua Truxton Stupka, PA-C  10/28/2015 2:05 PM

## 2016-01-09 ENCOUNTER — Other Ambulatory Visit (HOSPITAL_BASED_OUTPATIENT_CLINIC_OR_DEPARTMENT_OTHER): Payer: 59

## 2016-01-09 ENCOUNTER — Telehealth: Payer: Self-pay | Admitting: Hematology

## 2016-01-09 ENCOUNTER — Encounter: Payer: Self-pay | Admitting: Hematology

## 2016-01-09 ENCOUNTER — Ambulatory Visit (HOSPITAL_BASED_OUTPATIENT_CLINIC_OR_DEPARTMENT_OTHER): Payer: 59 | Admitting: Hematology

## 2016-01-09 VITALS — BP 130/67 | HR 83 | Temp 98.2°F | Resp 18 | Ht 61.0 in | Wt 171.6 lb

## 2016-01-09 DIAGNOSIS — E559 Vitamin D deficiency, unspecified: Secondary | ICD-10-CM | POA: Diagnosis not present

## 2016-01-09 DIAGNOSIS — E538 Deficiency of other specified B group vitamins: Secondary | ICD-10-CM

## 2016-01-09 DIAGNOSIS — D509 Iron deficiency anemia, unspecified: Secondary | ICD-10-CM

## 2016-01-09 DIAGNOSIS — Z903 Acquired absence of stomach [part of]: Secondary | ICD-10-CM

## 2016-01-09 DIAGNOSIS — K912 Postsurgical malabsorption, not elsewhere classified: Secondary | ICD-10-CM

## 2016-01-09 DIAGNOSIS — E509 Vitamin A deficiency, unspecified: Secondary | ICD-10-CM

## 2016-01-09 LAB — COMPREHENSIVE METABOLIC PANEL
ALBUMIN: 3.5 g/dL (ref 3.5–5.0)
ALK PHOS: 95 U/L (ref 40–150)
ALT: 17 U/L (ref 0–55)
AST: 15 U/L (ref 5–34)
Anion Gap: 9 mEq/L (ref 3–11)
BUN: 9.9 mg/dL (ref 7.0–26.0)
CALCIUM: 8.8 mg/dL (ref 8.4–10.4)
CO2: 21 mEq/L — ABNORMAL LOW (ref 22–29)
CREATININE: 0.8 mg/dL (ref 0.6–1.1)
Chloride: 110 mEq/L — ABNORMAL HIGH (ref 98–109)
EGFR: 76 mL/min/{1.73_m2} — ABNORMAL LOW (ref >90)
GLUCOSE: 149 mg/dL — AB (ref 70–140)
POTASSIUM: 3.7 meq/L (ref 3.5–5.1)
SODIUM: 140 meq/L (ref 136–145)
TOTAL PROTEIN: 6.7 g/dL (ref 6.4–8.3)
Total Bilirubin: <0.3 mg/dL (ref 0.20–1.20)

## 2016-01-09 LAB — CBC & DIFF AND RETIC
BASO%: 0.2 % (ref 0.0–2.0)
BASOS ABS: 0 10*3/uL (ref 0.0–0.1)
EOS ABS: 0.1 10*3/uL (ref 0.0–0.5)
EOS%: 1.1 % (ref 0.0–7.0)
HEMATOCRIT: 41.1 % (ref 34.8–46.6)
HEMOGLOBIN: 13.2 g/dL (ref 11.6–15.9)
IMMATURE RETIC FRACT: 6.6 % (ref 1.60–10.00)
LYMPH#: 2.1 10*3/uL (ref 0.9–3.3)
LYMPH%: 16.9 % (ref 14.0–49.7)
MCH: 29.9 pg (ref 25.1–34.0)
MCHC: 32.1 g/dL (ref 31.5–36.0)
MCV: 93.2 fL (ref 79.5–101.0)
MONO#: 0.5 10*3/uL (ref 0.1–0.9)
MONO%: 3.9 % (ref 0.0–14.0)
NEUT%: 77.9 % — ABNORMAL HIGH (ref 38.4–76.8)
NEUTROS ABS: 9.5 10*3/uL — AB (ref 1.5–6.5)
Platelets: 313 10*3/uL (ref 145–400)
RBC: 4.41 10*6/uL (ref 3.70–5.45)
RDW: 13.6 % (ref 11.2–14.5)
RETIC %: 1.08 % (ref 0.70–2.10)
RETIC CT ABS: 47.63 10*3/uL (ref 33.70–90.70)
WBC: 12.2 10*3/uL — AB (ref 3.9–10.3)

## 2016-01-09 NOTE — Telephone Encounter (Signed)
cld both #'s on account and no answer-mailed copy of sch to pt home address

## 2016-01-09 NOTE — Telephone Encounter (Signed)
per pof to sch pt appt-gave pt copy of avs °

## 2016-01-10 LAB — VITAMIN B12: Vitamin B12: 336 pg/mL (ref 211–946)

## 2016-01-10 LAB — FERRITIN: Ferritin: 122 ng/ml (ref 9–269)

## 2016-01-10 LAB — VITAMIN D 25 HYDROXY (VIT D DEFICIENCY, FRACTURES): VIT D 25 HYDROXY: 14 ng/mL — AB (ref 30.0–100.0)

## 2016-01-12 NOTE — Progress Notes (Signed)
[facility]land [facility].    HEMATOLOGY/ONCOLOGY CLINIC NOTE  Date of Service: 01/09/2016  Patient Care Team: Catha GosselinKevin Little, MD as PCP - General (Family Medicine)  CHIEF COMPLAINTS/PURPOSE OF CONSULTATION:  Severe iron deficiency Anemia  HISTORY OF PRESENTING ILLNESS: please see my previous note for details on initial presentation.  INTERVAL HISTORY  Ms. Tildon HuskyBeeson is here for her scheduled followup for iron deficiency anemia.  She notes that she had her hip surgery and this went well. She has been feeling well and is back to work full time and has had good energy level. Hemoglobin levels today are within normal limits. No other acute new symptoms at this time.  MEDICAL HISTORY:  Past Medical History  Diagnosis Date  . Hypothyroidism   . Mood swings (HCC)   . Arthritis   . Anemia     hx  . DM type 2 (diabetes mellitus, type 2) (HCC)   . Hypercholesterolemia   . Gastric bypass status for obesity     h/o gastric bypass surgery followed by lap band  . Osteopenia     on BMD in 07/2012, repeat in 2 yrs    SURGICAL HISTORY: Past Surgical History  Procedure Laterality Date  . Gastric bypass  02  . Cesarean section      31 yrs ago  . Knee arthroscopy with anterior cruciate ligament (acl) repair Right     28 yrs ago  . Hip fracture surgery Right 29 yrs ago    screws  . Shoulder arthroscopy with subacromial decompression and open rotator c Right     28 yrs ago  . Bunions Bilateral 08  . Hammer toe surgery Bilateral 08  . 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 Laparoscopic gastric banding  11  . Cholecystectomy    . Colonoscopy    . Total hip arthroplasty Right 10/04/2015    Procedure: TOTAL RIGHT HIP ARTHROPLASTY;  Surgeon: Frederico Hammananiel Caffrey, MD;  Location[facility] OR;  Service: Orthopedics;  Laterality: Right;    SOCIAL HISTORY: Social History   Social History  . Marital Status: Married    Spouse Name: N/A  . Number of Children: N/A  . Years of Education: N/A   Occupational History  . Not on  file.   Social History Main Topics  . Smoking status: Never Smoker   . Smokeless tobacco: Not on file  . Alcohol Use: No  . Drug Use: No  . Sexual Activity: Not on file   Other Topics Concern  . Not on file   Social History Narrative    FAMILY HISTORY: History reviewed. No pertinent family history.  ALLERGIES:  is allergic to erythromycin.  MEDICATIONS:  Current Outpatient Prescriptions  Medication Sig Dispense Refill  . b complex vitamins capsule Take 1 capsule by mouth daily. 30 capsule 3  . cyanocobalamin (,VITAMIN B-12,) 1000 MCG/ML injection Inject 1,000 mcg into the muscle every 30 (thirty) days.    . ferrous sulfate 325 (65 FE) MG tablet Take 1 tablet (325 mg total) by mouth 2 (two) times daily with a meal.    . folic acid (FOLVITE) 800 MCG tablet Take 400 mcg by mouth daily.    Marland Kitchen. levothyroxine (SYNTHROID, LEVOTHROID) 200 MCG tablet Take 200 mcg by mouth daily before breakfast.    . PARoxetine (PAXIL) 20 MG tablet Take 40 mg by mouth daily.    Marland Kitchen. Propylene Glycol (SYSTANE BALANCE) 0.6 % SOLN Place 1 drop into both eyes as needed (for dry eyes).    .Marland Kitchen  vitamin A [id number] UNIT capsule Take 3 capsules (30,000 Units total) by mouth once a week.    . Vitamin D, Ergocalciferol, (DRISDOL) 50000 units CAPS capsule Take 1 capsule (50,000 Units total) by mouth 3 (three) times a week. Saturday 36 capsule 0   No current facility-administered medications for this visit.   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  REVIEW OF SYSTEMS:    10 Point review of Systems was done is negative except as noted above.  PHYSICAL EXAMINATION: ECOG PERFORMANCE STATUS: 1  . Filed Vitals:   01/09/16 1549  BP: 130/67  Pulse: 83  Temp: 98.2 F (36.8 C)  Resp: 18   Filed Weights   01/09/16 1549  Weight: 171 lb 9.6 oz (77.837 kg)   .Body mass index is 32.44 kg/(m^2).  GENERAL:alert, in no acute distress and comfortable SKIN: skin color,  texture, turgor are normal, no rashes or significant lesions EYES: normal, conjunctiva are pink and non-injected, sclera clear OROPHARYNX:no exudate, no erythema and lips, buccal mucosa, and tongue normal  NECK: supple, no JVD, thyroid normal size, non-tender, without nodularity LYMPH:  no palpable lymphadenopathy in the cervical, axillary or inguinal LUNGS: clear to auscultation with normal respiratory effort. HEART: regular rate & rhythm,  no murmurs and no lower extremity edema ABDOMEN: abdomen soft, non-tender, normoactive bowel sounds  Musculoskeletal: no cyanosis of digits and no clubbing  PSYCH: alert & oriented x 3 with fluent speech NEURO: no focal motor/sensory deficits  LABORATORY DATA:  I have reviewed the data as listed  . CBC Latest Ref Rng 01/09/2016 10/06/2015 10/05/2015  WBC 3.9 - 10.3 10e3/uL 12.2(H) 10.4 8.4  Hemoglobin 11.6 - 15.9 g/dL 60.413.2 5.4(U9.6(L) 10.2(L)  Hematocrit 34.8 - 46.6 % 41.1 29.9(L) 32.2(L)  Platelets 145 - 400 10e3/uL 313 183 200   . CBC    Component Value Date/Time   WBC 12.2* 01/09/2016 1453   WBC 10.4 10/06/2015 0456   RBC 4.41 01/09/2016 1453   RBC 3.15* 10/06/2015 0456   HGB 13.2 01/09/2016 1453   HGB 9.6* 10/06/2015 0456   HCT 41.1 01/09/2016 1453   HCT 29.9* 10/06/2015 0456   HCT 38.2 08/08/2015 1305   PLT 313 01/09/2016 1453   PLT 183 10/06/2015 0456   MCV 93.2 01/09/2016 1453   MCV 94.9 10/06/2015 0456   MCH 29.9 01/09/2016 1453   MCH 30.5 10/06/2015 0456   MCHC 32.1 01/09/2016 1453   MCHC 32.1 10/06/2015 0456   RDW 13.6 01/09/2016 1453   RDW 16.1* 10/06/2015 0456   LYMPHSABS 2.1 01/09/2016 1453   LYMPHSABS 2.2 09/23/2015 1104   MONOABS 0.5 01/09/2016 1453   MONOABS 0.4 09/23/2015 1104   EOSABS 0.1 01/09/2016 1453   EOSABS 0.1 09/23/2015 1104   BASOSABS 0.0 01/09/2016 1453   BASOSABS 0.0 09/23/2015 1104     . CMP Latest Ref Rng 01/09/2016 10/05/2015 10/04/2015  Glucose 70 - 140 mg/dl 981(X149(H) 914(N171(H) -  BUN 7.0 - 26.0 mg/dL  9.9 7 -  Creatinine 0.6 - 1.1 mg/dL 0.8 8.290.73 5.620.64  Sodium 130136 - 145 mEq/L 140 137 -  Potassium 3.5 - 5.1 mEq/L 3.7 3.3(L) -  Chloride 101 - 111 mmol/L - 104 -  CO2 22 - 29 mEq/L 21(L) 24 -  Calcium 8.4 - 10.4 mg/dL 8.8 7.7(L) -  Total Protein 6.4 - 8.3 g/dL 6.7 - -  Total Bilirubin 0.20 - 1.20 mg/dL <8.65<0.30 - -  Alkaline Phos 40 - 150  U/L 95 - -  AST 5 - 34 U/L 15 - -  ALT 0 - 55 U/L 17 - -    Lab Results  Component Value Date   FERRITIN 122 01/09/2016    Component     Latest Ref Rng 01/09/2016  Ferritin     9 - 269 ng/ml 122  Vitamin B12     211 - 946 pg/mL 336  Vitamin D, 25-Hydroxy     30.0 - 100.0 ng/mL 14.0 (L)     RADIOGRAPHIC STUDIES: I have personally reviewed the radiological images as listed and agreed with the findings in the report. No results found.  ASSESSMENT & PLAN:  68 caucasian female with h/o gastric bypass surgery and lap banding for weight loss with severe fatigue  1) Microcytic Anemia related to Severe iron deficiency. Ferritin 5 with 2% iron saturation. Patient has had a GI evaluation including EGD which was unrevealing and has not had any evidence of bleeding. Her iron def despite po iron intake is likely due to poor absorption due to her bariatric surgical procedures. Patient received IV feraheme 510 mg every weekly x3 doses Previously. She has had her surgery and is doing very well. Hemoglobin today is within normal limits. Ferritin is 122. Plan -No acute indication for IV iron right now -Continue oral ferrous sulfate replacement. -We will recheck iron labs in 6 months given increase iron department with blood loss from surgery.  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  B12 def - B12 levels are acceptable at this time. Plan -Continue sublingual B12 replacement and oral folate replacement. -Continue sublingual B12 thousand micrograms daily.  3)Vit D deficiency - persistent despite  ergocalciferol once weekly. Vitamin D level remain persistently low -continue on ergocalcferol 3 times weekly for the additional 3 months  -Might add cholecalciferol 5000 units daily orally. -could be taken with a fatty meal or olive oil to try to improve absorption.   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 -Continue management as per primary care physician.  Continue follow-up with primary care physician. RTC with Dr. Candise Che in 6 months with CBC, CMP, iron and B12    All of the patients questions were answered to her apparent satisfaction. The patient knows to call the clinic with any problems, questions or concerns.  I spent 15 minutes counseling the patient face to face. The total time spent in the appointment was 20 minutes and more than 50% was on counseling and direct patient cares.    Wyvonnia Lora MD MS AAHIVMS [facility] Hematology/Oncology Physician [facility]  (Office):       [phone] (Work cell):  [phone] (Fax):           [phone]

## 2016-03-14 ENCOUNTER — Other Ambulatory Visit: Payer: Self-pay | Admitting: Hematology

## 2016-03-17 ENCOUNTER — Other Ambulatory Visit: Payer: Self-pay | Admitting: Hematology

## 2016-03-20 ENCOUNTER — Other Ambulatory Visit: Payer: Self-pay | Admitting: *Deleted

## 2016-03-20 MED ORDER — VITAMIN D (ERGOCALCIFEROL) 1.25 MG (50000 UNIT) PO CAPS: 50000.0000 [IU] | ORAL_CAPSULE | ORAL | 0 refills | Status: DC

## 2016-06-21 ENCOUNTER — Telehealth: Payer: Self-pay | Admitting: Hematology

## 2016-06-21 NOTE — Telephone Encounter (Signed)
Lvm advising appt 12/21 moved to 08/03/16 @ 1.15pm due to md pal.

## 2016-07-09 ENCOUNTER — Other Ambulatory Visit: Payer: 59

## 2016-07-09 ENCOUNTER — Ambulatory Visit: Payer: 59 | Admitting: Hematology

## 2016-08-03 ENCOUNTER — Other Ambulatory Visit: Payer: 59

## 2016-08-03 ENCOUNTER — Ambulatory Visit: Payer: 59 | Admitting: Hematology

## 2016-08-10 ENCOUNTER — Telehealth: Payer: Self-pay | Admitting: Hematology

## 2016-08-10 NOTE — Telephone Encounter (Signed)
Patient called to reschedule missed appointments. 08/10/16

## 2016-08-20 ENCOUNTER — Ambulatory Visit: Payer: 59 | Admitting: Hematology

## 2016-08-20 ENCOUNTER — Other Ambulatory Visit: Payer: 59

## 2016-08-20 ENCOUNTER — Telehealth: Payer: Self-pay | Admitting: Hematology

## 2016-08-20 NOTE — Telephone Encounter (Signed)
Patient called to reschedule appointments. 08/20/16

## 2016-09-11 ENCOUNTER — Other Ambulatory Visit: Payer: 59

## 2016-09-11 ENCOUNTER — Ambulatory Visit: Payer: 59 | Admitting: Hematology

## 2016-11-29 IMAGING — CR DG CHEST 2V
2 series · 2 of 2 positions shown · non-contrast
Comparison: None.

CLINICAL DATA: Hip replacement.

EXAM:
CHEST  2 VIEW

[w chest pa]
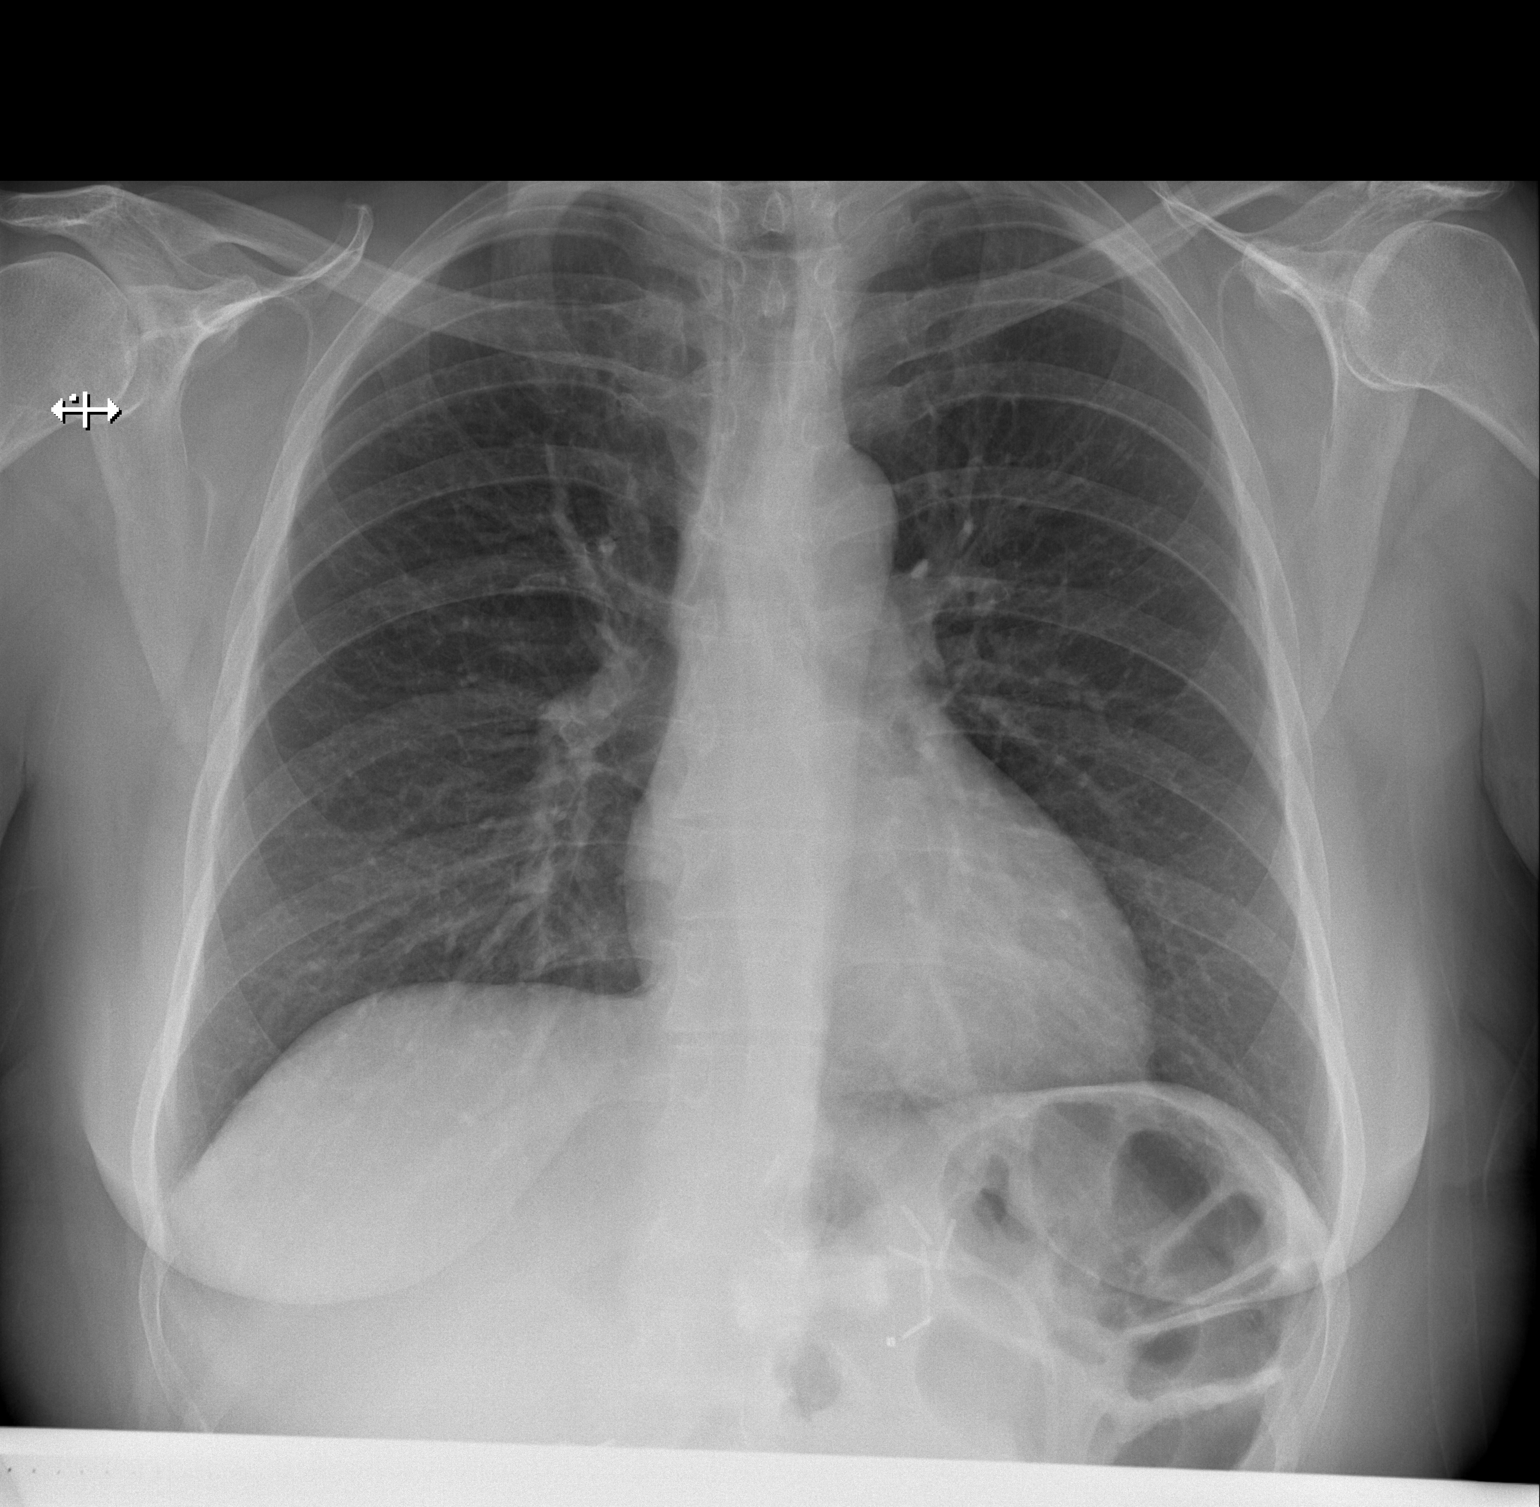

[w chest lat]
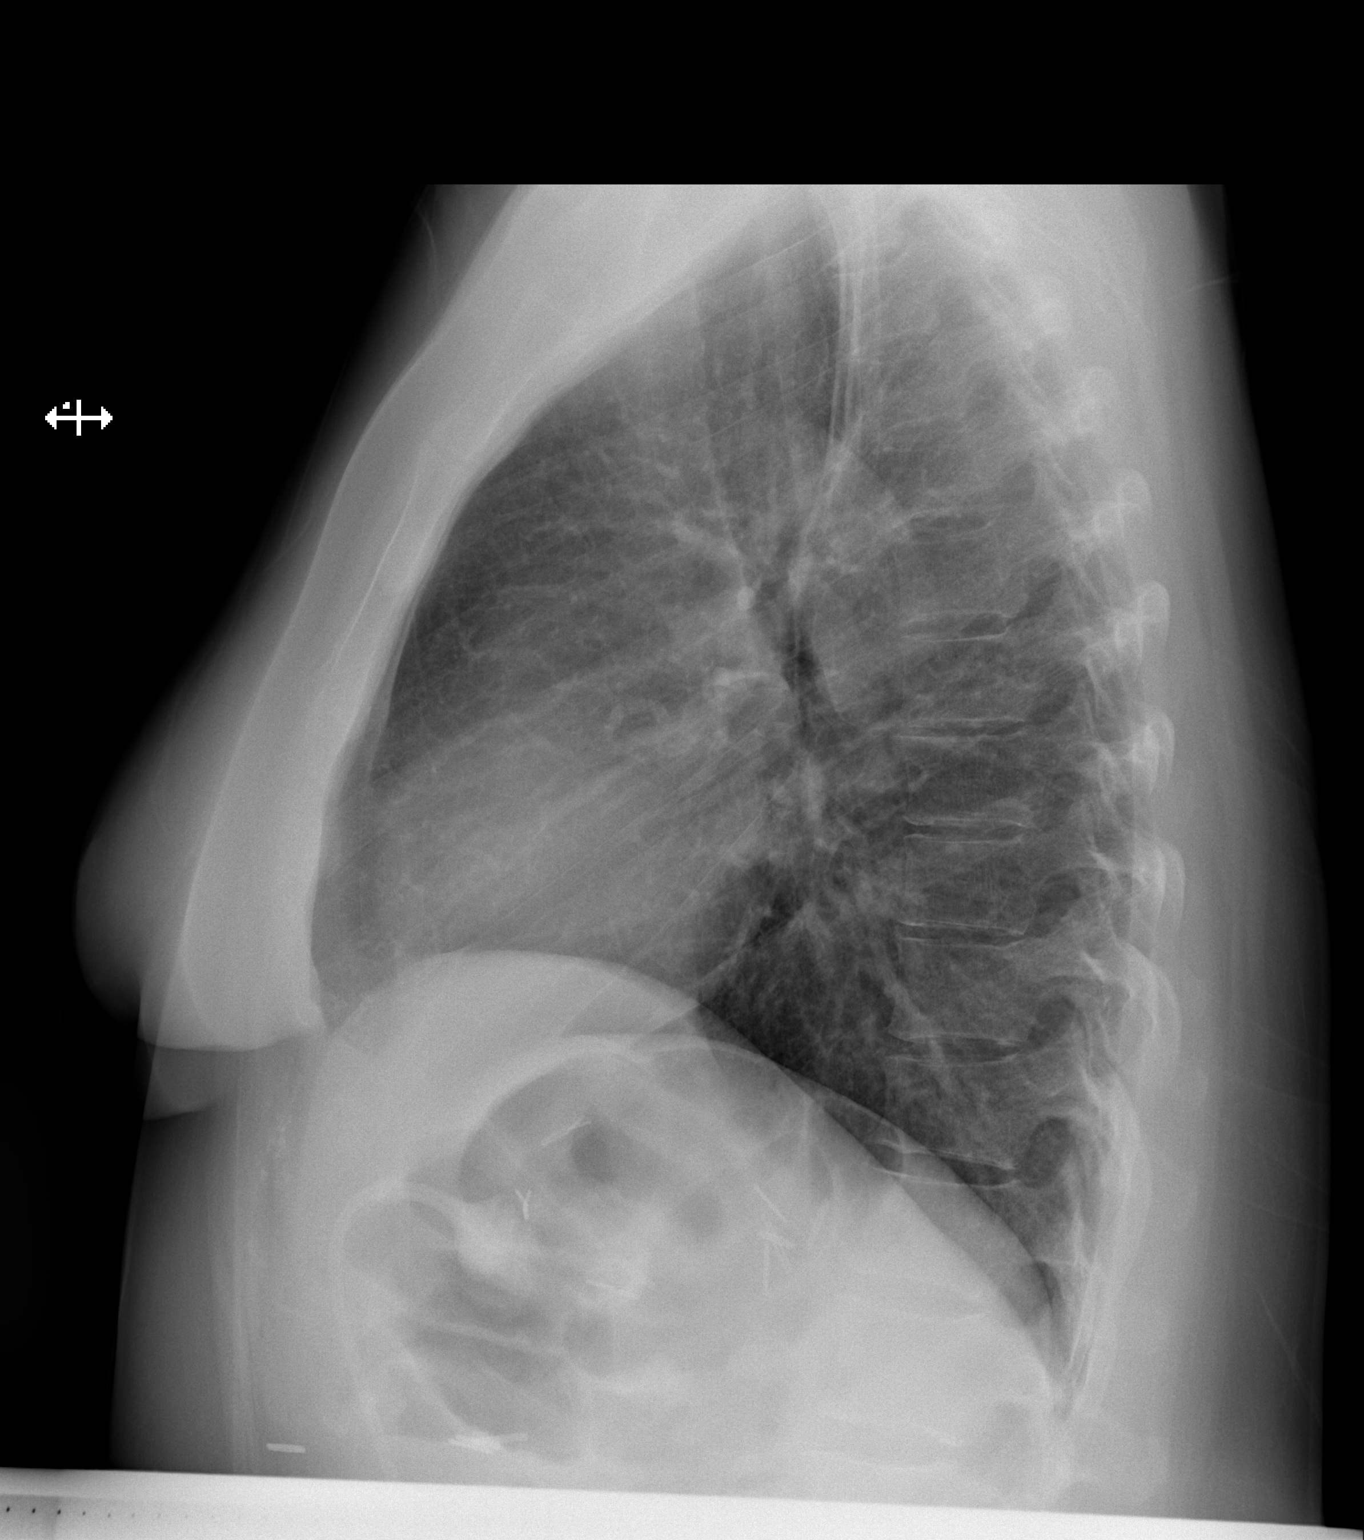

[2 of 2 positions shown; findings below may reference images not displayed]

FINDINGS: Mediastinum and hilar structures are normal. Lungs are clear. Heart
size normal. No pleural effusion or pneumothorax. Surgical clips in
the upper abdomen.
IMPRESSION: No acute cardiopulmonary disease.

## 2017-03-24 IMAGING — CR DG HIP (WITH OR WITHOUT PELVIS) 1V PORT*R*
1 series · 1 of 1 positions shown · non-contrast
Comparison: None.

CLINICAL DATA: Post right total hip replacement

EXAM:
DG HIP (WITH OR WITHOUT PELVIS) 1V PORT RIGHT

[AP]
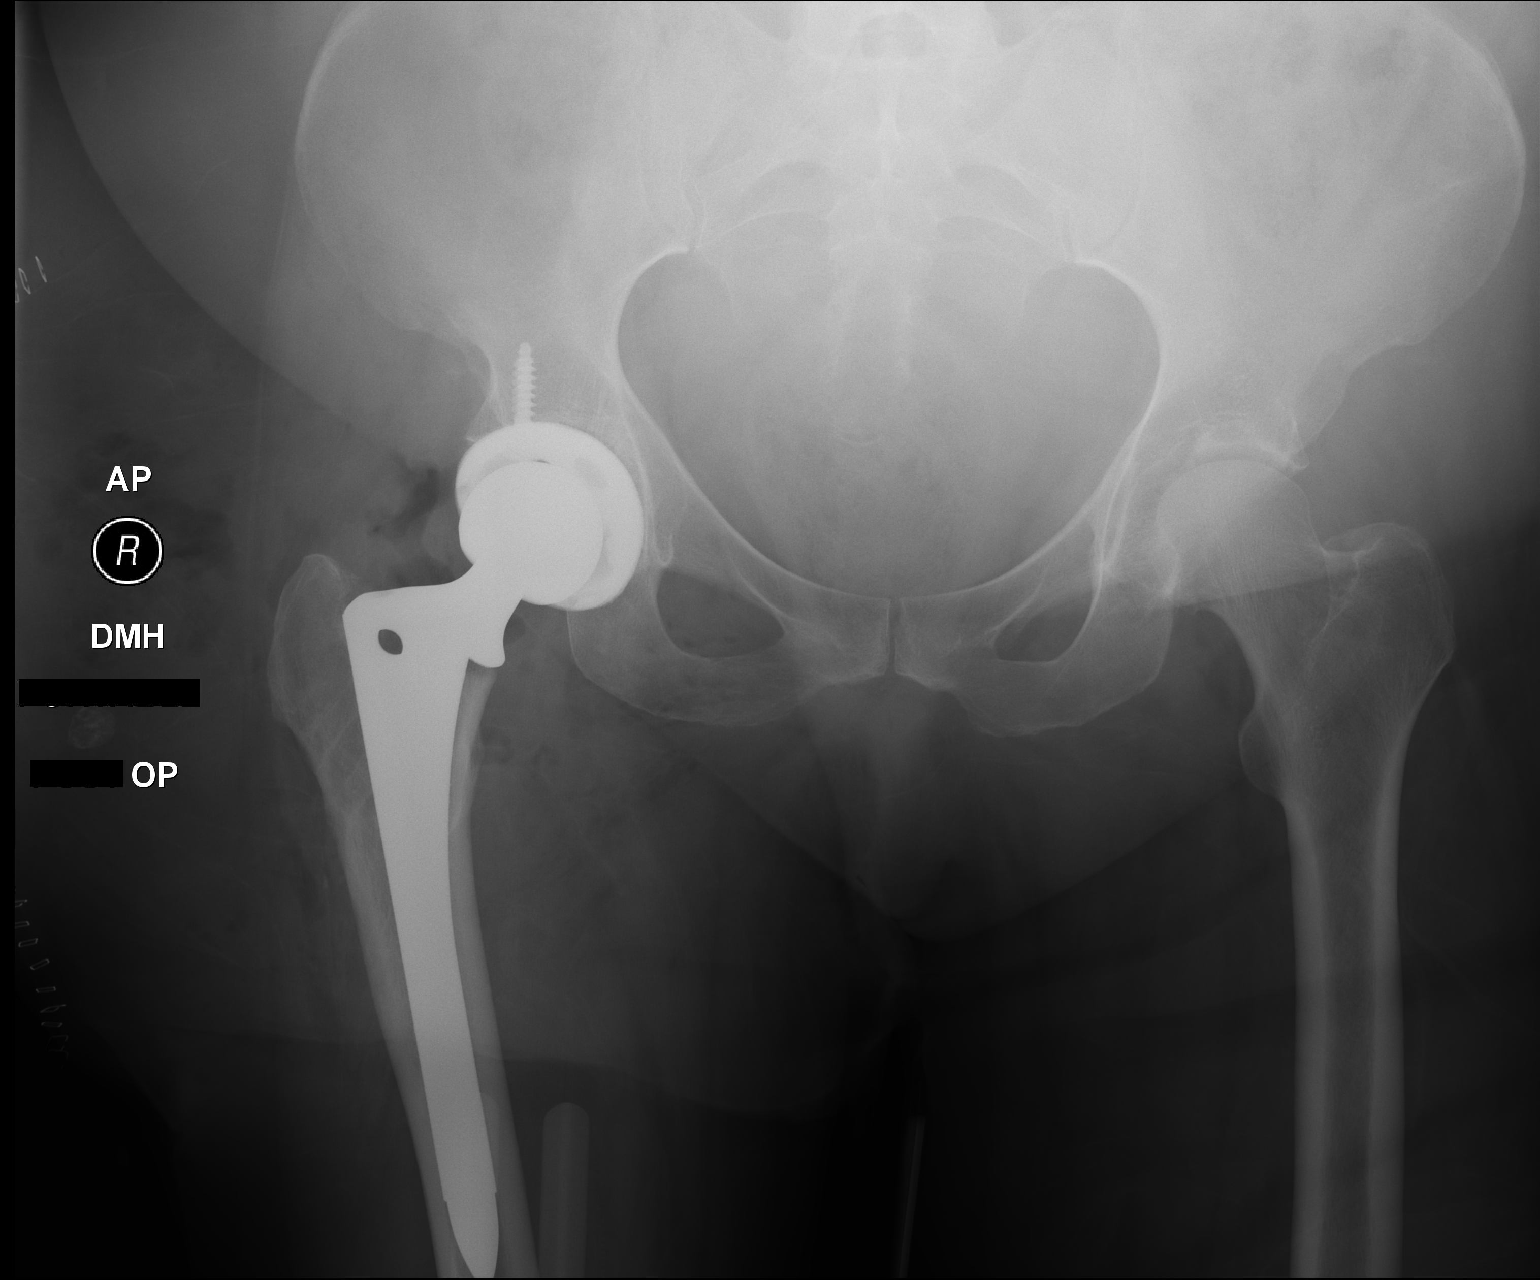

[1 of 1 positions shown; findings below may reference images not displayed]

FINDINGS: Changes of right hip replacement. Normal alignment. No hardware or
bony complicating feature. Soft tissue gas noted.
IMPRESSION: Right hip replacement.  No complicating feature.

## 2017-04-20 ENCOUNTER — Emergency Department (HOSPITAL_COMMUNITY)
Admission: EM | Admit: 2017-04-20 | Discharge: 2017-04-20 | Disposition: A | Discharge status: Home / Self Care | Payer: 59 | Attending: Emergency Medicine | Admitting: Emergency Medicine

## 2017-04-20 ENCOUNTER — Emergency Department (HOSPITAL_COMMUNITY): Payer: 59

## 2017-04-20 DIAGNOSIS — E039 Hypothyroidism, unspecified: Secondary | ICD-10-CM | POA: Diagnosis not present

## 2017-04-20 DIAGNOSIS — Z96641 Presence of right artificial hip joint: Secondary | ICD-10-CM | POA: Insufficient documentation

## 2017-04-20 DIAGNOSIS — D649 Anemia, unspecified: Secondary | ICD-10-CM | POA: Insufficient documentation

## 2017-04-20 DIAGNOSIS — R0789 Other chest pain: Secondary | ICD-10-CM | POA: Diagnosis not present

## 2017-04-20 DIAGNOSIS — R079 Chest pain, unspecified: Secondary | ICD-10-CM | POA: Diagnosis present

## 2017-04-20 DIAGNOSIS — E119 Type 2 diabetes mellitus without complications: Secondary | ICD-10-CM | POA: Insufficient documentation

## 2017-04-20 DIAGNOSIS — M25511 Pain in right shoulder: Secondary | ICD-10-CM | POA: Diagnosis not present

## 2017-04-20 DIAGNOSIS — Y9389 Activity, other specified: Secondary | ICD-10-CM | POA: Diagnosis not present

## 2017-04-20 DIAGNOSIS — M25571 Pain in right ankle and joints of right foot: Secondary | ICD-10-CM | POA: Diagnosis not present

## 2017-04-20 DIAGNOSIS — Z79899 Other long term (current) drug therapy: Secondary | ICD-10-CM | POA: Insufficient documentation

## 2017-04-20 DIAGNOSIS — Y929 Unspecified place or not applicable: Secondary | ICD-10-CM | POA: Diagnosis not present

## 2017-04-20 DIAGNOSIS — Y999 Unspecified external cause status: Secondary | ICD-10-CM | POA: Insufficient documentation

## 2017-04-20 MED ORDER — ACETAMINOPHEN 500 MG PO TABS
500.0000 mg | ORAL_TABLET | Freq: Once | ORAL | Status: AC
  Administered 2017-04-20: 500 mg via ORAL
  Filled 2017-04-20: qty 1
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
## 2017-04-20 NOTE — ED Notes (Signed)
Pt back from X-ray.  
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
## 2017-05-07 ENCOUNTER — Other Ambulatory Visit: Payer: Self-pay | Admitting: Family Medicine

## 2017-05-07 ENCOUNTER — Other Ambulatory Visit (HOSPITAL_COMMUNITY)
Admission: RE | Admit: 2017-05-07 | Discharge: 2017-05-07 | Disposition: A | Discharge status: Home / Self Care | Payer: 59 | Source: Ambulatory Visit | Attending: Family Medicine | Admitting: Family Medicine

## 2017-05-07 DIAGNOSIS — Z124 Encounter for screening for malignant neoplasm of cervix: Secondary | ICD-10-CM | POA: Diagnosis not present

## 2017-05-11 LAB — CYTOLOGY - PAP
Adequacy: ABSENT
DIAGNOSIS: NEGATIVE
HPV (WINDOPATH): NOT DETECTED

## 2017-05-27 ENCOUNTER — Other Ambulatory Visit (HOSPITAL_COMMUNITY): Payer: Self-pay | Admitting: Orthopedic Surgery

## 2017-05-27 DIAGNOSIS — M25551 Pain in right hip: Secondary | ICD-10-CM

## 2017-05-27 DIAGNOSIS — M25561 Pain in right knee: Secondary | ICD-10-CM

## 2017-06-04 ENCOUNTER — Ambulatory Visit (HOSPITAL_COMMUNITY)
Admission: RE | Admit: 2017-06-04 | Discharge: 2017-06-04 | Disposition: A | Discharge status: Home / Self Care | Payer: 59 | Source: Ambulatory Visit | Attending: Orthopedic Surgery | Admitting: Orthopedic Surgery

## 2017-06-04 ENCOUNTER — Encounter (HOSPITAL_COMMUNITY)
Admission: RE | Admit: 2017-06-04 | Discharge: 2017-06-04 | Disposition: A | Discharge status: Home / Self Care | Payer: 59 | Source: Ambulatory Visit | Attending: Orthopedic Surgery | Admitting: Orthopedic Surgery

## 2017-06-04 DIAGNOSIS — M25551 Pain in right hip: Secondary | ICD-10-CM | POA: Insufficient documentation

## 2017-06-04 MED ORDER — TECHNETIUM TC 99M MEDRONATE IV KIT
25.0000 | PACK | Freq: Once | INTRAVENOUS | Status: AC | PRN
  Administered 2017-06-04: 25 via INTRAVENOUS

## 2017-10-11 ENCOUNTER — Ambulatory Visit: Payer: 59 | Admitting: Neurology

## 2017-10-11 ENCOUNTER — Telehealth: Payer: Self-pay | Admitting: *Deleted

## 2017-10-11 NOTE — Telephone Encounter (Signed)
No showed new patient appointment. 

## 2017-10-12 ENCOUNTER — Encounter: Payer: Self-pay | Admitting: Neurology

## 2018-05-30 ENCOUNTER — Encounter: Payer: Self-pay | Admitting: Family Medicine

## 2018-05-30 ENCOUNTER — Ambulatory Visit: Payer: 59 | Admitting: Family Medicine

## 2018-05-30 VITALS — BP 128/80 | HR 79 | Ht 61.0 in | Wt 165.5 lb

## 2018-05-30 DIAGNOSIS — R252 Cramp and spasm: Secondary | ICD-10-CM | POA: Diagnosis not present

## 2018-05-30 DIAGNOSIS — Z0000 Encounter for general adult medical examination without abnormal findings: Secondary | ICD-10-CM

## 2018-05-30 DIAGNOSIS — Z1211 Encounter for screening for malignant neoplasm of colon: Secondary | ICD-10-CM | POA: Insufficient documentation

## 2018-05-30 DIAGNOSIS — Z903 Acquired absence of stomach [part of]: Secondary | ICD-10-CM | POA: Diagnosis not present

## 2018-05-30 DIAGNOSIS — D508 Other iron deficiency anemias: Secondary | ICD-10-CM | POA: Diagnosis not present

## 2018-05-30 DIAGNOSIS — F332 Major depressive disorder, recurrent severe without psychotic features: Secondary | ICD-10-CM

## 2018-05-30 DIAGNOSIS — E538 Deficiency of other specified B group vitamins: Secondary | ICD-10-CM

## 2018-05-30 DIAGNOSIS — E039 Hypothyroidism, unspecified: Secondary | ICD-10-CM | POA: Insufficient documentation

## 2018-05-30 DIAGNOSIS — R413 Other amnesia: Secondary | ICD-10-CM

## 2018-05-30 DIAGNOSIS — K912 Postsurgical malabsorption, not elsewhere classified: Secondary | ICD-10-CM

## 2018-05-30 DIAGNOSIS — E559 Vitamin D deficiency, unspecified: Secondary | ICD-10-CM

## 2018-05-30 LAB — COMPREHENSIVE METABOLIC PANEL
ALBUMIN: 3.9 g/dL (ref 3.5–5.2)
ALK PHOS: 75 U/L (ref 39–117)
ALT: 13 U/L (ref 0–35)
AST: 16 U/L (ref 0–37)
BILIRUBIN TOTAL: 0.4 mg/dL (ref 0.2–1.2)
BUN: 12 mg/dL (ref 6–23)
CO2: 26 mEq/L (ref 19–32)
Calcium: 8.9 mg/dL (ref 8.4–10.5)
Chloride: 106 mEq/L (ref 96–112)
Creatinine, Ser: 0.78 mg/dL (ref 0.40–1.20)
GFR: 79.32 mL/min (ref >60.00)
Glucose, Bld: 105 mg/dL — ABNORMAL HIGH (ref 70–99)
POTASSIUM: 4.6 meq/L (ref 3.5–5.1)
Sodium: 140 mEq/L (ref 135–145)
TOTAL PROTEIN: 6.3 g/dL (ref 6.0–8.3)

## 2018-05-30 LAB — URINALYSIS, ROUTINE W REFLEX MICROSCOPIC
Bilirubin Urine: NEGATIVE
Hgb urine dipstick: NEGATIVE
Ketones, ur: NEGATIVE
Leukocytes, UA: NEGATIVE
Nitrite: NEGATIVE
Specific Gravity, Urine: 1.02 (ref 1.000–1.030)
Total Protein, Urine: NEGATIVE
Urine Glucose: NEGATIVE
Urobilinogen, UA: 0.2 (ref 0.0–1.0)
pH: 5.5 (ref 5.0–8.0)

## 2018-05-30 LAB — CBC
HEMATOCRIT: 39 % (ref 36.0–46.0)
HEMOGLOBIN: 12.7 g/dL (ref 12.0–15.0)
MCHC: 32.5 g/dL (ref 30.0–36.0)
MCV: 93.8 fl (ref 78.0–100.0)
PLATELETS: 330 10*3/uL (ref 150.0–400.0)
RBC: 4.16 Mil/uL (ref 3.87–5.11)
RDW: 14.5 % (ref 11.5–15.5)
WBC: 7.6 10*3/uL (ref 4.0–10.5)

## 2018-05-30 LAB — LIPID PANEL
CHOL/HDL RATIO: 2
Cholesterol: 154 mg/dL (ref 0–200)
HDL: 66.8 mg/dL (ref >39.00)
LDL Cholesterol: 66 mg/dL (ref 0–99)
NONHDL: 87.33
Triglycerides: 109 mg/dL (ref 0.0–149.0)
VLDL: 21.8 mg/dL (ref 0.0–40.0)

## 2018-05-30 LAB — TSH: TSH: 2.73 u[IU]/mL (ref 0.35–4.50)

## 2018-05-30 LAB — VITAMIN B12: Vitamin B-12: 102 pg/mL — ABNORMAL LOW (ref 211–911)

## 2018-05-30 LAB — MAGNESIUM: MAGNESIUM: 2.1 mg/dL (ref 1.5–2.5)

## 2018-05-30 LAB — LDL CHOLESTEROL, DIRECT: LDL DIRECT: 77 mg/dL

## 2018-05-30 LAB — VITAMIN D 25 HYDROXY (VIT D DEFICIENCY, FRACTURES): VITD: 12.14 ng/mL — AB (ref 30.00–100.00)

## 2018-05-30 NOTE — Patient Instructions (Addendum)
Living With Depression Everyone experiences occasional disappointment, sadness, and loss in their lives. When you are feeling down, blue, or sad for at least 2 weeks in a row, it may mean that you have depression. Depression can affect your thoughts and feelings, relationships, daily activities, and physical health. It is caused by changes in the way your brain functions. If you receive a diagnosis of depression, your health care provider will tell you which type of depression you have and what treatment options are available to you. If you are living with depression, there are ways to help you recover from it and also ways to prevent it from coming back. How to cope with lifestyle changes Coping with stress Stress is your body's reaction to life changes and events, both good and bad. Stressful situations may include:  Getting married.  The death of a spouse.  Losing a job.  Retiring.  Having a baby.  Stress can last just a few hours or it can be ongoing. Stress can play a major role in depression, so it is important to learn both how to cope with stress and how to think about it differently. Talk with your health care provider or a counselor if you would like to learn more about stress reduction. He or she may suggest some stress reduction techniques, such as:  Music therapy. This can include creating music or listening to music. Choose music that you enjoy and that inspires you.  Mindfulness-based meditation. This kind of meditation can be done while sitting or walking. It involves being aware of your normal breaths, rather than trying to control your breathing.  Centering prayer. This is a kind of meditation that involves focusing on a spiritual word or phrase. Choose a word, phrase, or sacred image that is meaningful to you and that brings you peace.  Deep breathing. To do this, expand your stomach and inhale slowly through your nose. Hold your breath for 3-5 seconds, then exhale  slowly, allowing your stomach muscles to relax.  Muscle relaxation. This involves intentionally tensing muscles then relaxing them.  Choose a stress reduction technique that fits your lifestyle and personality. Stress reduction techniques take time and practice to develop. Set aside 5-15 minutes a day to do them. Therapists can offer training in these techniques. The training may be covered by some insurance plans. Other things you can do to manage stress include:  Keeping a stress diary. This can help you learn what triggers your stress and ways to control your response.  Understanding what your limits are and saying no to requests or events that lead to a schedule that is too full.  Thinking about how you respond to certain situations. You may not be able to control everything, but you can control how you react.  Adding humor to your life by watching funny films or TV shows.  Making time for activities that help you relax and not feeling guilty about spending your time this way.  Medicines Your health care provider may suggest certain medicines if he or she feels that they will help improve your condition. Avoid using alcohol and other substances that may prevent your medicines from working properly (may interact). It is also important to:  Talk with your pharmacist or health care provider about all the medicines that you take, their possible side effects, and what medicines are safe to take together.  Make it your goal to take part in all treatment decisions (shared decision-making). This includes giving input on the side  effects of medicines. It is best if shared decision-making with your health care provider is part of your total treatment plan.  If your health care provider prescribes a medicine, you may not notice the full benefits of it for 4-8 weeks. Most people who are treated for depression need to be on medicine for at least 6-12 months after they feel better. If you are taking  medicines as part of your treatment, do not stop taking medicines without first talking to your health care provider. You may need to have the medicine slowly decreased (tapered) over time to decrease the risk of harmful side effects. Relationships Your health care provider may suggest family therapy along with individual therapy and drug therapy. While there may not be family problems that are causing you to feel depressed, it is still important to make sure your family learns as much as they can about your mental health. Having your family's support can help make your treatment successful. How to recognize changes in your condition Everyone has a different response to treatment for depression. Recovery from major depression happens when you have not had signs of major depression for two months. This may mean that you will start to:  Have more interest in doing activities.  Feel less hopeless than you did 2 months ago.  Have more energy.  Overeat less often, or have better or improving appetite.  Have better concentration.  Your health care provider will work with you to decide the next steps in your recovery. It is also important to recognize when your condition is getting worse. Watch for these signs:  Having fatigue or low energy.  Eating too much or too little.  Sleeping too much or too little.  Feeling restless, agitated, or hopeless.  Having trouble concentrating or making decisions.  Having unexplained physical complaints.  Feeling irritable, angry, or aggressive.  Get help as soon as you or your family members notice these symptoms coming back. How to get support and help from others How to talk with friends and family members about your condition Talking to friends and family members about your condition can provide you with one way to get support and guidance. Reach out to trusted friends or family members, explain your symptoms to them, and let them know that you are  working with a health care provider to treat your depression. Financial resources Not all insurance plans cover mental health care, so it is important to check with your insurance carrier. If paying for co-pays or counseling services is a problem, search for a local or county mental health care center. They may be able to offer public mental health care services at low or no cost when you are not able to see a private health care provider. If you are taking medicine for depression, you may be able to get the generic form, which may be less expensive. Some makers of prescription medicines also offer help to patients who cannot afford the medicines they need. Follow these instructions at home:  Get the right amount and quality of sleep.  Cut down on using caffeine, tobacco, alcohol, and other potentially harmful substances.  Try to exercise, such as walking or lifting small weights.  Take over-the-counter and prescription medicines only as told by your health care provider.  Eat a healthy diet that includes plenty of vegetables, fruits, whole grains, low-fat dairy products, and lean protein. Do not eat a lot of foods that are high in solid fats, added sugars, or salt.  Keep all follow-up visits as told by your health care provider. This is important. Contact a health care provider if:  You stop taking your antidepressant medicines, and you have any of these symptoms: ? Nausea. ? Headache. ? Feeling lightheaded. ? Chills and body aches. ? Not being able to sleep (insomnia).  You or your friends and family think your depression is getting worse. Get help right away if:  You have thoughts of hurting yourself or others. If you ever feel like you may hurt yourself or others, or have thoughts about taking your own life, get help right away. You can go to your nearest emergency department or call:  Your local emergency services (911 in the [place]  A suicide crisis helpline, such as the  [place] at 214-023-5353. This is open 24-hours a day.  Summary  If you are living with depression, there are ways to help you recover from it and also ways to prevent it from coming back.  Work with your health care team to create a management plan that includes counseling, stress management techniques, and healthy lifestyle habits. This information is not intended to replace advice given to you by your health care provider. Make sure you discuss any questions you have with your health care provider. Document Released: 06/08/2016 Document Revised: 06/08/2016 Document Reviewed: 06/08/2016 Elsevier Interactive Patient Education  2018 [place] Maintenance, Female Adopting a healthy lifestyle and getting preventive care can go a long way to promote health and wellness. Talk with your health care provider about what schedule of regular examinations is right for you. This is a good chance for you to check in with your provider about disease prevention and staying healthy. In between checkups, there are plenty of things you can do on your own. Experts have done a lot of research about which lifestyle changes and preventive measures are most likely to keep you healthy. Ask your health care provider for more information. Weight and diet Eat a healthy diet  Be sure to include plenty of vegetables, fruits, low-fat dairy products, and lean protein.  Do not eat a lot of foods high in solid fats, added sugars, or salt.  Get regular exercise. This is one of the most important things you can do for your health. ? Most adults should exercise for at least 150 minutes each week. The exercise should increase your heart rate and make you sweat (moderate-intensity exercise). ? Most adults should also do strengthening exercises at least twice a week. This is in addition to the moderate-intensity exercise.  Maintain a healthy weight  Body mass index (BMI) is a  measurement that can be used to identify possible weight problems. It estimates body fat based on height and weight. Your health care provider can help determine your BMI and help you achieve or maintain a healthy weight.  For females 78 years of age and older: ? A BMI below 18.5 is considered underweight. ? A BMI of 18.5 to 24.9 is normal. ? A BMI of 25 to 29.9 is considered overweight. ? A BMI of 30 and above is considered obese.  Watch levels of cholesterol and blood lipids  You should start having your blood tested for lipids and cholesterol at 62 years of age, then have this test every 5 years.  You may need to have your cholesterol levels checked more often if: ? Your lipid or cholesterol levels are high. ? You are older than 62 years of age. ? You are  at high risk for heart disease.  Cancer screening Lung Cancer  Lung cancer screening is recommended for adults 100-51 years old who are at high risk for lung cancer because of a history of smoking.  A yearly low-dose CT scan of the lungs is recommended for people who: ? Currently smoke. ? Have quit within the past 15 years. ? Have at least a 30-pack-year history of smoking. A pack year is smoking an average of one pack of cigarettes a day for 1 year.  Yearly screening should continue until it has been 15 years since you quit.  Yearly screening should stop if you develop a health problem that would prevent you from having lung cancer treatment.  Breast Cancer  Practice breast self-awareness. This means understanding how your breasts normally appear and feel.  It also means doing regular breast self-exams. Let your health care provider know about any changes, no matter how small.  If you are in your 20s or 30s, you should have a clinical breast exam (CBE) by a health care provider every 1-3 years as part of a regular health exam.  If you are 6 or older, have a CBE every year. Also consider having a breast X-ray (mammogram)  every year.  If you have a family history of breast cancer, talk to your health care provider about genetic screening.  If you are at high risk for breast cancer, talk to your health care provider about having an MRI and a mammogram every year.  Breast cancer gene (BRCA) assessment is recommended for women who have family members with BRCA-related cancers. BRCA-related cancers include: ? Breast. ? Ovarian. ? Tubal. ? Peritoneal cancers.  Results of the assessment will determine the need for genetic counseling and BRCA1 and BRCA2 testing.  Cervical Cancer Your health care provider may recommend that you be screened regularly for cancer of the pelvic organs (ovaries, uterus, and vagina). This screening involves a pelvic examination, including checking for microscopic changes to the surface of your cervix (Pap test). You may be encouraged to have this screening done every 3 years, beginning at age 96.  For women ages 10-65, health care providers may recommend pelvic exams and Pap testing every 3 years, or they may recommend the Pap and pelvic exam, combined with testing for human papilloma virus (HPV), every 5 years. Some types of HPV increase your risk of cervical cancer. Testing for HPV may also be done on women of any age with unclear Pap test results.  Other health care providers may not recommend any screening for nonpregnant women who are considered low risk for pelvic cancer and who do not have symptoms. Ask your health care provider if a screening pelvic exam is right for you.  If you have had past treatment for cervical cancer or a condition that could lead to cancer, you need Pap tests and screening for cancer for at least 20 years after your treatment. If Pap tests have been discontinued, your risk factors (such as having a new sexual partner) need to be reassessed to determine if screening should resume. Some women have medical problems that increase the chance of getting cervical  cancer. In these cases, your health care provider may recommend more frequent screening and Pap tests.  Colorectal Cancer  This type of cancer can be detected and often prevented.  Routine colorectal cancer screening usually begins at 62 years of age and continues through 62 years of age.  Your health care provider may recommend screening at an  earlier age if you have risk factors for colon cancer.  Your health care provider may also recommend using home test kits to check for hidden blood in the stool.  A small camera at the end of a tube can be used to examine your colon directly (sigmoidoscopy or colonoscopy). This is done to check for the earliest forms of colorectal cancer.  Routine screening usually begins at age 83.  Direct examination of the colon should be repeated every 5-10 years through 62 years of age. However, you may need to be screened more often if early forms of precancerous polyps or small growths are found.  Skin Cancer  Check your skin from head to toe regularly.  Tell your health care provider about any new moles or changes in moles, especially if there is a change in a mole's shape or color.  Also tell your health care provider if you have a mole that is larger than the size of a pencil eraser.  Always use sunscreen. Apply sunscreen liberally and repeatedly throughout the day.  Protect yourself by wearing long sleeves, pants, a wide-brimmed hat, and sunglasses whenever you are outside.  Heart disease, diabetes, and high blood pressure  High blood pressure causes heart disease and increases the risk of stroke. High blood pressure is more likely to develop in: ? People who have blood pressure in the high end of the normal range (130-139/85-89 mm Hg). ? People who are overweight or obese. ? People who are African American.  If you are 30-35 years of age, have your blood pressure checked every 3-5 years. If you are 72 years of age or older, have your blood  pressure checked every year. You should have your blood pressure measured twice-once when you are at a hospital or clinic, and once when you are not at a hospital or clinic. Record the average of the two measurements. To check your blood pressure when you are not at a hospital or clinic, you can use: ? An automated blood pressure machine at a pharmacy. ? A home blood pressure monitor.  If you are between 67 years and 39 years old, ask your health care provider if you should take aspirin to prevent strokes.  Have regular diabetes screenings. This involves taking a blood sample to check your fasting blood sugar level. ? If you are at a normal weight and have a low risk for diabetes, have this test once every three years after 62 years of age. ? If you are overweight and have a high risk for diabetes, consider being tested at a younger age or more often. Preventing infection Hepatitis B  If you have a higher risk for hepatitis B, you should be screened for this virus. You are considered at high risk for hepatitis B if: ? You were born in a country where hepatitis B is common. Ask your health care provider which countries are considered high risk. ? Your parents were born in a high-risk country, and you have not been immunized against hepatitis B (hepatitis B vaccine). ? You have HIV or AIDS. ? You use needles to inject street drugs. ? You live with someone who has hepatitis B. ? You have had sex with someone who has hepatitis B. ? You get hemodialysis treatment. ? You take certain medicines for conditions, including cancer, organ transplantation, and autoimmune conditions.  Hepatitis C  Blood testing is recommended for: ? Everyone born from [birth year] through 1965. ? Anyone with known risk factors for hepatitis C.  Sexually transmitted infections (STIs)  You should be screened for sexually transmitted infections (STIs) including gonorrhea and chlamydia if: ? You are sexually active and are  younger than 62 years of age. ? You are older than 62 years of age and your health care provider tells you that you are at risk for this type of infection. ? Your sexual activity has changed since you were last screened and you are at an increased risk for chlamydia or gonorrhea. Ask your health care provider if you are at risk.  If you do not have HIV, but are at risk, it may be recommended that you take a prescription medicine daily to prevent HIV infection. This is called pre-exposure prophylaxis (PrEP). You are considered at risk if: ? You are sexually active and do not regularly use condoms or know the HIV status of your partner(s). ? You take drugs by injection. ? You are sexually active with a partner who has HIV.  Talk with your health care provider about whether you are at high risk of being infected with HIV. If you choose to begin PrEP, you should first be tested for HIV. You should then be tested every 3 months for as long as you are taking PrEP. Pregnancy  If you are premenopausal and you may become pregnant, ask your health care provider about preconception counseling.  If you may become pregnant, take 400 to 800 micrograms (mcg) of folic acid every day.  If you want to prevent pregnancy, talk to your health care provider about birth control (contraception). Osteoporosis and menopause  Osteoporosis is a disease in which the bones lose minerals and strength with aging. This can result in serious bone fractures. Your risk for osteoporosis can be identified using a bone density scan.  If you are 68 years of age or older, or if you are at risk for osteoporosis and fractures, ask your health care provider if you should be screened.  Ask your health care provider whether you should take a calcium or vitamin D supplement to lower your risk for osteoporosis.  Menopause may have certain physical symptoms and risks.  Hormone replacement therapy may reduce some of these symptoms and  risks. Talk to your health care provider about whether hormone replacement therapy is right for you. Follow these instructions at home:  Schedule regular health, dental, and eye exams.  Stay current with your immunizations.  Do not use any tobacco products including cigarettes, chewing tobacco, or electronic cigarettes.  If you are pregnant, do not drink alcohol.  If you are breastfeeding, limit how much and how often you drink alcohol.  Limit alcohol intake to no more than 1 drink per day for nonpregnant women. One drink equals 12 ounces of beer, 5 ounces of wine, or 1 ounces of hard liquor.  Do not use street drugs.  Do not share needles.  Ask your health care provider for help if you need support or information about quitting drugs.  Tell your health care provider if you often feel depressed.  Tell your health care provider if you have ever been abused or do not feel safe at home. This information is not intended to replace advice given to you by your health care provider. Make sure you discuss any questions you have with your health care provider. Document Released: 01/19/2011 Document Revised: 12/12/2015 Document Reviewed: 04/09/2015 Elsevier Interactive Patient Education  [employer].

## 2018-05-30 NOTE — Progress Notes (Addendum)
Subjective:  Patient ID: Renee [name], female    DOB: [birth year]  Age: 62 y.o. MRN: [id number]  CC: Establish Care   HPI Renee [name] presents for establishment of care with a significant list of current medical issues and problems.  Past medical history of morbid obesity treated with gastric bypass.  She had suffered from hypertension, diabetes and elevated cholesterol.  These all resolved after the weight loss associated with her bypass surgery.  She continues to deal with vitamin deficiency and iron deficiency.  She is status post iron IV therapy about a year and a half ago.  She is seeing endocrinology for hypothyroidism.  Her Synthroid dose is been stable at 200 mcg daily on a fasting stomach before eating.  She is taking Prempro for the last 8 years of her life.  She still has her uterus and ovaries.  Last Pap and mammograms were normal.  She had tried to stop her Prempro in the past and developed severe night sweats.  She has a long-standing history of depression treated with dual therapy with Paxil and Wellbutrin.  She is having major issues with insomnia.  She takes her Paxil and Wellbutrin in the morning.  She may go for weeks with only a few hours of sleep nightly.  She has never contemplated self-harm but does feel at times that things would be better off if she were not here.  She is in her second marriage of 12 years.  She has 1 grown daughter who lives in [place].  Her husband has 2 grown children.  She does not smoke, use illicit drugs or drink alcohol.  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  Her job is stressful.  She is concerned about memory issues.  At work she finds herself writing everything down that her boss asks her to do.  She still has to ask him to clarify.  She has actually gotten lost driving.  Her husband drove her to our clinic yesterday and she still had to call him to find out how to get here today.  She tells  me that her husband has also been concerned about her memory. History Renee [name] has a past medical history of Anemia, Arthritis, Depression, DM type 2 (diabetes mellitus, type 2) (HCC), Gastric bypass status for obesity, Hypercholesterolemia, Hypothyroidism, Mood swings, and Osteopenia.   She has a past surgical history that includes Gastric bypass (02); Cesarean section; Knee arthroscopy with anterior cruciate ligament (acl) repair (Right); Hip fracture surgery (Right, 29 yrs ago); Shoulder arthroscopy with subacromial decompression and open rotator cuff repair, open bicep tendon repair (Right); bunions (Bilateral, 08); Hammer toe surgery (Bilateral, 08); mortons syndrome (Right, 12); Laparoscopic gastric banding (11); Cholecystectomy; Colonoscopy; and Total hip arthroplasty (Right, 10/04/2015).   Her family history includes Alcohol abuse in her father and sister; Arthritis in her daughter, father, and maternal grandmother; Depression in her mother; Diabetes in her daughter and sister; Drug abuse in her mother and sister; Early death in her mother; Hearing loss in her maternal grandmother; Heart attack in her father and mother; Heart disease in her father and maternal grandmother; Hyperlipidemia in her daughter, father, mother, and sister; Hypertension in her daughter, father, maternal grandmother, mother, and sister; Mental illness in her mother and sister; Miscarriages / [place] in her daughter.She reports that she has never smoked. She has never used smokeless tobacco. She reports that she does not drink alcohol or use drugs.  Outpatient Medications Prior to Visit  Medication Sig Dispense Refill  . Apoaequorin (PREVAGEN PO) Take by mouth.    . cholecalciferol (VITAMIN D3) 25 MCG (1000 UT) tablet Take 1,000 Units by mouth daily.    Marland Kitchen estrogen, conjugated,-medroxyprogesterone (PREMPRO) 0.45-1.5 MG tablet Take 1 tablet by mouth daily.    Marland Kitchen levothyroxine (SYNTHROID, LEVOTHROID) 200 MCG tablet Take 200  mcg by mouth daily before breakfast.    . buPROPion (WELLBUTRIN XL) 150 MG 24 hr tablet Take 150 mg by mouth daily.    Marland Kitchen PARoxetine (PAXIL) 20 MG tablet Take 40 mg by mouth daily.    Marland Kitchen b complex vitamins capsule Take 1 capsule by mouth daily. 30 capsule 3  . cyanocobalamin (,VITAMIN B-12,) 1000 MCG/ML injection Inject 1,000 mcg into the muscle every 30 (thirty) days.    . ferrous sulfate 325 (65 FE) MG tablet Take 1 tablet (325 mg total) by mouth 2 (two) times daily with a meal.    . folic acid (FOLVITE) 800 MCG tablet Take 400 mcg by mouth daily.    Marland Kitchen Propylene Glycol (SYSTANE BALANCE) 0.6 % SOLN Place 1 drop into both eyes as needed (for dry eyes).    . vitamin A 13244 UNIT capsule Take 3 capsules (30,000 Units total) by mouth once a week.    . Vitamin D, Ergocalciferol, (DRISDOL) 50000 units CAPS capsule Take 1 capsule (50,000 Units total) by mouth 3 (three) times a week. Saturday 36 capsule 0   No facility-administered medications prior to visit.     ROS Review of Systems  Constitutional: Negative for diaphoresis, fatigue, fever and unexpected weight change.  HENT: Negative.   Eyes: Negative for photophobia and visual disturbance.  Respiratory: Negative.   Cardiovascular: Negative.   Endocrine: Negative for polyphagia and polyuria.  Genitourinary: Negative.   Musculoskeletal: Negative for gait problem and joint swelling.  Skin: Negative for pallor and rash.  Allergic/Immunologic: Negative for immunocompromised state.  Neurological: Negative for speech difficulty, light-headedness and numbness.  Hematological: Does not bruise/bleed easily.  Psychiatric/Behavioral: Positive for confusion, decreased concentration, dysphoric mood and sleep disturbance. Negative for hallucinations, self-injury and suicidal ideas.     Depression screen PHQ 2/9 05/30/2018  Decreased Interest 3  Down, Depressed, Hopeless 2  PHQ - 2 Score 5  Altered sleeping 3  Tired, decreased energy 2  Change in  appetite 1  Feeling bad or failure about yourself  1  Trouble concentrating 3  Moving slowly or fidgety/restless 3  Suicidal thoughts 2  PHQ-9 Score 20     Objective:  BP 128/80 (BP Location: Right Arm, Patient Position: Sitting, Cuff Size: Normal)   Pulse 79   Ht 5\' 1"  (1.549 m)   Wt 165 lb 8 oz (75.1 kg)   SpO2 98%   BMI 31.27 kg/m   Physical Exam  Constitutional: She is oriented to person, place, and time. She appears well-developed and well-nourished. No distress.  HENT:  Head: Normocephalic and atraumatic.  Right Ear: External ear normal.  Left Ear: External ear normal.  Mouth/Throat: Oropharynx is clear and moist. No oropharyngeal exudate.  Eyes: Pupils are equal, round, and reactive to light. Conjunctivae and EOM are normal. Right eye exhibits no discharge. Left eye exhibits no discharge. No scleral icterus.  Neck: Neck supple. No JVD present. No tracheal deviation present. No thyromegaly present.  Cardiovascular: Normal rate, regular rhythm and normal heart sounds.  Pulmonary/Chest: Effort normal and breath sounds normal.  Abdominal: Bowel sounds are normal.  Lymphadenopathy:  She has no cervical adenopathy.  Neurological: She is alert and oriented to person, place, and time.  Skin: Skin is warm and dry. She is not diaphoretic.  Psychiatric: She has a normal mood and affect. Her behavior is normal.      Assessment & Plan:   Renee [name] was seen today for establish care.  Diagnoses and all orders for this visit:  Intestinal malabsorption following gastrectomy -     CBC -     Comprehensive metabolic panel  Other iron deficiency anemia -     CBC -     Iron, TIBC and Ferritin Panel  B12 deficiency -     Vitamin B12 -     B Complex Vitamins (B COMPLEX-B12) TABS; Take one packet daily  Vitamin D deficiency -     VITAMIN D 25 Hydroxy (Vit-D Deficiency, Fractures) -     Vitamin D, Ergocalciferol, (DRISDOL) 1.25 MG (50000 UT) CAPS capsule; Take 1 capsule (50,000  Units total) by mouth every 7 (seven) days.  Healthcare maintenance -     CBC -     Comprehensive metabolic panel -     LDL cholesterol, direct -     HIV Antibody (routine testing w rflx) -     Lipid panel -     Urinalysis, Routine w reflex microscopic -     Hepatitis C antibody -     Ambulatory referral to Gynecology  Muscle cramp -     CBC -     Comprehensive metabolic panel -     Magnesium  Severe episode of recurrent major depressive disorder, without psychotic features (HCC) -     Ambulatory referral to Psychiatry  Memory disorder -     Ambulatory referral to Neurology  Hypothyroidism, unspecified type -     TSH   I have discontinued Clista L. Hedlund's folic acid, b complex vitamins, beta carotene, ferrous sulfate, Propylene Glycol, cyanocobalamin, and Vitamin D (Ergocalciferol). I am also having her start on Vitamin D (Ergocalciferol) and B Complex-B12. Additionally, I am having her maintain her levothyroxine, cholecalciferol, estrogen (conjugated)-medroxyprogesterone, and Apoaequorin (PREVAGEN PO).  Meds ordered this encounter  Medications  . Vitamin D, Ergocalciferol, (DRISDOL) 1.25 MG (50000 UT) CAPS capsule    Sig: Take 1 capsule (50,000 Units total) by mouth every 7 (seven) days.    Dispense:  30 capsule    Refill:  2  . B Complex Vitamins (B COMPLEX-B12) TABS    Sig: Take one packet daily    Dispense:  90 each    Refill:  1   Patient reported a significantly elevated score on the PHQ 9.  I believe that she was being honest.  She may benefit from a change in her antidepressant regimen and talking therapy.  I question whether or not her memory issues are related to her insomnia and stress at her job.  She agrees to go psychiatric and neurological consultation.  She will see GYN for taper for her HRT.  Follow-up back with me in 3 months.  Follow-up: Return in about 3 months (around 08/30/2018).  Mliss Sax, MD

## 2018-05-31 ENCOUNTER — Other Ambulatory Visit: Payer: Self-pay | Admitting: Family Medicine

## 2018-05-31 LAB — HEPATITIS C ANTIBODY
Hepatitis C Ab: NONREACTIVE
SIGNAL TO CUT-OFF: 0.01 (ref <1.00)

## 2018-05-31 LAB — IRON,TIBC AND FERRITIN PANEL
%SAT: 14 % (calc) — ABNORMAL LOW (ref 16–45)
Ferritin: 19 ng/mL (ref 16–288)
IRON: 57 ug/dL (ref 45–160)
TIBC: 416 ug/dL (ref 250–450)

## 2018-05-31 LAB — HIV ANTIBODY (ROUTINE TESTING W REFLEX): HIV: NONREACTIVE

## 2018-05-31 MED ORDER — PAROXETINE HCL 20 MG PO TABS: 40.0000 mg | ORAL_TABLET | Freq: Every day | ORAL | 3 refills | Status: DC

## 2018-05-31 MED ORDER — VITAMIN D (ERGOCALCIFEROL) 1.25 MG (50000 UNIT) PO CAPS: 50000.0000 [IU] | ORAL_CAPSULE | ORAL | 2 refills | Status: DC

## 2018-05-31 MED ORDER — BUPROPION HCL ER (XL) 150 MG PO TB24: 150.0000 mg | ORAL_TABLET | Freq: Every day | ORAL | 3 refills | Status: DC

## 2018-05-31 MED ORDER — B COMPLEX-B12 PO TABS: ORAL_TABLET | ORAL | 1 refills | Status: DC

## 2018-05-31 NOTE — Telephone Encounter (Signed)
Requested medication (s) are due for refill today: ?  Requested medication (s) are on the active medication list: yes  Last refill:  ?  Future visit scheduled: no  Notes to clinic:  Pt seen by Dr Doreene BurkeKremer 05/30/18; prescriptions per historical provider     Requested Prescriptions  Pending Prescriptions Disp Refills   buPROPion (WELLBUTRIN XL) 150 MG 24 hr tablet      Sig: Take 1 tablet (150 mg total) by mouth daily.     Psychiatry: Antidepressants - bupropion Passed - 05/31/2018  3:01 PM      Passed - Completed PHQ-2 or PHQ-9 in the last 360 days.      Passed - Last BP in normal range    BP Readings from Last 1 Encounters:  05/30/18 128/80         Passed - Valid encounter within last 6 months    Recent Outpatient Visits          Yesterday Intestinal malabsorption following gastrectomy   [facility], [place] CoventryWilliam Alfred, MD            PARoxetine (PAXIL) 20 MG tablet      Sig: Take 2 tablets (40 mg total) by mouth daily.     Psychiatry:  Antidepressants - SSRI Passed - 05/31/2018  3:01 PM      Passed - Completed PHQ-2 or PHQ-9 in the last 360 days.      Passed - Valid encounter within last 6 months    Recent Outpatient Visits          Yesterday Intestinal malabsorption following gastrectomy   [facility], [place] Coventry[facility] Alfred, MD

## 2018-05-31 NOTE — Telephone Encounter (Signed)
Copied from CRM [id number]. Topic: Quick Communication - Lab Results (Clinic Use ONLY) >> May 31, 2018  1:18 PM Self, Dayton ScrapeSarah E, CMA wrote: Called patient to inform them of her lab results. When patient returns call, triage nurse may disclose results. >> May 31, 2018  2:30 PM Arlyss Gandyichardson, Melba Araki N, NT wrote: Pt returning call to receive lab results.

## 2018-05-31 NOTE — Telephone Encounter (Signed)
Last office visit yesterday. Pt has 1 more pill of Paxil and Wellbutrin. No record of last refill. Both are listed as historical meds.

## 2018-05-31 NOTE — Addendum Note (Signed)
Addended by: Andrez GrimeKREMER, Larell Baney A on: 05/31/2018 04:21 PM   Modules accepted: Orders

## 2018-06-01 ENCOUNTER — Encounter: Payer: Self-pay | Admitting: Family Medicine

## 2018-06-02 ENCOUNTER — Encounter: Payer: Self-pay | Admitting: Family Medicine

## 2018-06-28 ENCOUNTER — Encounter: Payer: Self-pay | Admitting: Sports Medicine

## 2018-06-28 ENCOUNTER — Ambulatory Visit: Payer: 59 | Admitting: Sports Medicine

## 2018-06-28 ENCOUNTER — Ambulatory Visit (INDEPENDENT_AMBULATORY_CARE_PROVIDER_SITE_OTHER): Payer: 59

## 2018-06-28 VITALS — BP 126/79 | HR 85

## 2018-06-28 DIAGNOSIS — M7751 Other enthesopathy of right foot: Secondary | ICD-10-CM

## 2018-06-28 DIAGNOSIS — M79671 Pain in right foot: Secondary | ICD-10-CM

## 2018-06-28 DIAGNOSIS — M722 Plantar fascial fibromatosis: Secondary | ICD-10-CM | POA: Diagnosis not present

## 2018-06-28 DIAGNOSIS — M7750 Other enthesopathy of unspecified foot: Secondary | ICD-10-CM

## 2018-06-28 MED ORDER — TRIAMCINOLONE ACETONIDE 10 MG/ML IJ SUSP
10.0000 mg | Freq: Once | INTRAMUSCULAR | Status: AC
  Administered 2018-06-28: 10 mg

## 2018-06-28 NOTE — Patient Instructions (Signed)

## 2018-06-28 NOTE — Progress Notes (Signed)
Subjective: Renee [name] is a 62 y.o. female patient presents to office with complaint of moderate heel pain on the right. Patient admits to post static dyskinesia for 1 month in duration. Patient has treated this problem with over-the-counter insole using work boots copper support stocking and Aleve twice daily and lace up brace with no relief.  Patient does not recall any type of trauma or injury.  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  Denies any other pedal complaints.   Admits to a past history of bunion and hammertoe repair and reports no pain at these previous surgical sites.  Review of Systems  Musculoskeletal: Positive for joint pain and myalgias.  All other systems reviewed and are negative.    Patient Active Problem List   Diagnosis Date Noted  . Healthcare maintenance 05/30/2018  . Muscle cramp 05/30/2018  . Severe episode of recurrent major depressive disorder, without psychotic features (HCC) 05/30/2018  . Memory disorder 05/30/2018  . Hypothyroidism 05/30/2018  . Post-traumatic osteoarthritis of right hip 10/04/2015  . Iron deficiency anemia 06/27/2015  . B12 deficiency 06/27/2015  . Vitamin D deficiency 06/27/2015  . Complications of gastric bypass surgery 06/27/2015  . Intestinal malabsorption following gastrectomy 06/27/2015    Current Outpatient Medications on File Prior to Visit  Medication Sig Dispense Refill  . Apoaequorin (PREVAGEN PO) Take by mouth.    . B Complex Vitamins (B COMPLEX-B12) TABS Take one packet daily 90 each 1  . buPROPion (WELLBUTRIN XL) 150 MG 24 hr tablet Take 1 tablet (150 mg total) by mouth daily. 30 tablet 3  . cholecalciferol (VITAMIN D3) 25 MCG (1000 UT) tablet Take 1,000 Units by mouth daily.    Marland Kitchen. estrogen, conjugated,-medroxyprogesterone (PREMPRO) 0.45-1.5 MG tablet Take 1 tablet by mouth daily.    Marland Kitchen. levothyroxine (SYNTHROID, LEVOTHROID) 200 MCG tablet Take 200 mcg by mouth daily before breakfast.    . PARoxetine  (PAXIL) 20 MG tablet Take 2 tablets (40 mg total) by mouth daily. 60 tablet 3  . Vitamin D, Ergocalciferol, (DRISDOL) 1.25 MG (50000 UT) CAPS capsule Take 1 capsule (50,000 Units total) by mouth every 7 (seven) days. 30 capsule 2   No current facility-administered medications on file prior to visit.     Allergies  Allergen Reactions  . Erythromycin Palpitations    Objective: Physical Exam General: The patient is alert and oriented x3 in no acute distress.  Dermatology: Skin is warm, dry and supple bilateral lower extremities. Nails 1-10 are normal. There is no erythema, edema, no eccymosis, no open lesions present. Integument is otherwise unremarkable.  Vascular: Dorsalis Pedis pulse and Posterior Tibial pulse are 2/4 bilateral. Capillary fill time is immediate to all digits.  Neurological: Grossly intact to light touch with an achilles reflex of +2/5 and a  negative Tinel's sign bilateral.  Musculoskeletal: Tenderness to palpation at the medial calcaneal tubercale and through the insertion of the plantar fascia on the right foot. No pain with compression of calcaneus bilateral. No pain with tuning fork to calcaneus bilateral. No pain with calf compression bilateral. There is decreased Ankle joint range of motion bilateral. All other joints range of motion within normal limits bilateral. Strength 5/5 in all groups bilateral.  Mild lesser hammertoe that is currently asymptomatic.  Gait: Unassisted, Antalgic avoid weight on right heel  Xray, Right foot:  Normal osseous mineralization. Joint spaces preserved. No fracture/dislocation/boney destruction.  There is mild lesser hammertoe.  No calcaneal spur present with mild thickening of plantar  fascia. No other soft tissue abnormalities or radiopaque foreign bodies.   Assessment and Plan: Problem List Items Addressed This Visit    None    Visit Diagnoses    Plantar fasciitis of right foot    -  Primary   Relevant Medications    triamcinolone acetonide (KENALOG) 10 MG/ML injection 10 mg (Completed)   Other Relevant Orders   DG Foot Complete Right (Completed)   Pain of right heel          -Complete examination performed.  -Xrays reviewed -Discussed with patient in detail the condition of plantar fasciitis, how this occurs and general treatment options. Explained both conservative and surgical treatments.  -After oral consent and aseptic prep, injected a mixture containing 1 ml of 2%  plain lidocaine, 1 ml 0.5% plain marcaine, 0.5 ml of kenalog 10 and 0.5 ml of dexamethasone phosphate into right heel. Post-injection care discussed with patient.  -Patient may call if she feels like she needs a prescription for meloxicam and Medrol Dosepak -Recommended good supportive shoes and advised use of OTC insert.  Advised patient to avoid from using both brace and orthotic because this may be causing too much pressure to her heel which can worsen the pain and symptoms - Explained in detail the use of the Night splint which was dispensed at today's visit. -Explained and dispensed to patient daily stretching exercises. -Recommend patient to ice affected area 1-2x daily. -Patient to return to office in 4 weeks for follow up or sooner if problems or questions arise.  Asencion Islam, DPM

## 2018-07-04 ENCOUNTER — Ambulatory Visit (INDEPENDENT_AMBULATORY_CARE_PROVIDER_SITE_OTHER): Payer: 59 | Admitting: Gynecology

## 2018-07-04 ENCOUNTER — Encounter: Payer: Self-pay | Admitting: Gynecology

## 2018-07-04 VITALS — BP 118/76 | Ht 62.0 in | Wt 164.0 lb

## 2018-07-04 DIAGNOSIS — N952 Postmenopausal atrophic vaginitis: Secondary | ICD-10-CM | POA: Diagnosis not present

## 2018-07-04 DIAGNOSIS — Z01419 Encounter for gynecological examination (general) (routine) without abnormal findings: Secondary | ICD-10-CM

## 2018-07-04 DIAGNOSIS — Z79890 Hormone replacement therapy (postmenopausal): Secondary | ICD-10-CM

## 2018-07-04 NOTE — Patient Instructions (Signed)
Follow-up in 1 year for annual exam, sooner as needed. 

## 2018-07-04 NOTE — Progress Notes (Signed)
    Renee EchevariaSherry L [name] 06/17/1956 [id number]        62 y.o.  [id number] new patient for annual gynecologic exam.  Doing well without gynecologic complaints.  Currently on Prempro started in her perimenopause early 8050s.  No history of GYN problems in the past.  No bleeding now.    Past medical history,surgical history, problem list, medications, allergies, family history and social history were all reviewed and documented as reviewed in the EPIC chart.  ROS:  Performed with pertinent positives and negatives included in the history, assessment and plan.   Additional significant findings : None   Exam: Kennon PortelaKim Gardner assistant Vitals:   07/04/18 1535  BP: 118/76  Weight: 164 lb (74.4 kg)  Height: 5\' 2"  (1.575 m)   Body mass index is 30 kg/m.  General appearance:  Normal affect, orientation and appearance. Skin: Grossly normal HEENT: Without gross lesions.  No cervical or supraclavicular adenopathy. Thyroid normal.  Lungs:  Clear without wheezing, rales or rhonchi Cardiac: RR, without RMG Abdominal:  Soft, nontender, without masses, guarding, rebound, organomegaly or hernia Breasts:  Examined lying and sitting without masses, retractions, discharge or axillary adenopathy. Pelvic:  Ext, BUS, Vagina: With atrophic changes  Cervix: With atrophic changes  Uterus: Anteverted, normal size, shape and contour, midline and mobile nontender   Adnexa: Without masses or tenderness    Anus and perineum: Normal   Rectovaginal: Normal sphincter tone without palpated masses or tenderness.    Assessment/Plan:  62 y.o. [id number] female for annual gynecologic exam.   1. Postmenopausal/atrophic genital changes/HRT.  Currently on Prempro 0.45/1.5.  Doing well with no bleeding.  We reviewed the whole issue of HRT with various studies discussed.  Increased risk of thrombosis such as stroke heart attack DVT and the increased risk of breast cancer in the WHI study was reviewed.  Benefits to include symptom relief and  possible cardiovascular and bone health when started early as in her case was also discussed.  She did run out and stopped it for several days and started having unacceptable hot flushes and sweats.  She feels there is a quality of life issue and would like to continue.  Refill x1 year provided. 2. Mammography 05/2018.  Continue with annual mammography next year.  Breast exam normal today. 3. Pap smear/HPV 2018.  No Pap smear done today.  No history of abnormal Pap smears previously.  Plan repeat Pap smear/HPV at 5-year interval per current screening guidelines. 4. Colonoscopy due now she is going to call and schedule. 5. DEXA reported 2018.  Done through [facility] and I do not have a copy of this.  She will follow-up with them in reference to bone health. 6. Health maintenance.  No routine lab work done as patient does this elsewhere.  Follow-up 1 year, sooner as needed.   Dara Lordsimothy P Dezarae Mcclaran MD, 4:18 PM 07/04/2018

## 2018-07-05 ENCOUNTER — Encounter: Payer: Self-pay | Admitting: Gynecology

## 2018-07-06 ENCOUNTER — Encounter: Payer: Self-pay | Admitting: Sports Medicine

## 2018-07-06 MED ORDER — CONJ ESTROG-MEDROXYPROGEST ACE 0.45-1.5 MG PO TABS: 1.0000 | ORAL_TABLET | Freq: Every day | ORAL | 3 refills | Status: AC

## 2018-07-07 MED ORDER — METHYLPREDNISOLONE 4 MG PO TBPK: ORAL_TABLET | ORAL | 0 refills | Status: DC

## 2018-07-26 ENCOUNTER — Ambulatory Visit: Payer: 59 | Admitting: Sports Medicine

## 2018-07-26 ENCOUNTER — Encounter: Payer: Self-pay | Admitting: Sports Medicine

## 2018-07-26 DIAGNOSIS — M79671 Pain in right foot: Secondary | ICD-10-CM

## 2018-07-26 DIAGNOSIS — M722 Plantar fascial fibromatosis: Secondary | ICD-10-CM | POA: Diagnosis not present

## 2018-07-26 MED ORDER — MELOXICAM 15 MG PO TABS: 15.0000 mg | ORAL_TABLET | Freq: Every day | ORAL | 0 refills | Status: DC

## 2018-07-26 MED ORDER — TRAMADOL HCL 50 MG PO TABS: 50.0000 mg | ORAL_TABLET | Freq: Three times a day (TID) | ORAL | 0 refills | Status: AC | PRN

## 2018-07-26 NOTE — Progress Notes (Addendum)
Subjective: Jodie EchevariaSherry L Reister is a 63 y.o. female returns to office for follow up evaluation after Right heel injection for plantar fasciitis, injection #1 administered 3 weeks ago. Patient states that the injection seems to help her pain for about 4-5 days before it slowly started to come back and get worse back to the point to where it is pretty severe.  Patient reports that she even call for her steroid by mouth and completed that and did not notice any relief from the medication her night splint or any type of exercise or therapy that she has been doing.  Patient denies any increased warmth redness swelling to her heel or any other constitutional symptoms at this time.  Patient denies any recent changes in medications or new problems since last visit.   Patient Active Problem List   Diagnosis Date Noted  . Healthcare maintenance 05/30/2018  . Muscle cramp 05/30/2018  . Severe episode of recurrent major depressive disorder, without psychotic features (HCC) 05/30/2018  . Memory disorder 05/30/2018  . Hypothyroidism 05/30/2018  . Post-traumatic osteoarthritis of right hip 10/04/2015  . Iron deficiency anemia 06/27/2015  . B12 deficiency 06/27/2015  . Vitamin D deficiency 06/27/2015  . Complications of gastric bypass surgery 06/27/2015  . Intestinal malabsorption following gastrectomy 06/27/2015    Current Outpatient Medications on File Prior to Visit  Medication Sig Dispense Refill  . Apoaequorin (PREVAGEN PO) Take by mouth.    . B Complex Vitamins (B COMPLEX-B12) TABS Take one packet daily 90 each 1  . buPROPion (WELLBUTRIN XL) 150 MG 24 hr tablet Take 1 tablet (150 mg total) by mouth daily. 30 tablet 3  . cholecalciferol (VITAMIN D3) 25 MCG (1000 UT) tablet Take 1,000 Units by mouth daily.    Marland Kitchen. estrogen, conjugated,-medroxyprogesterone (PREMPRO) 0.45-1.5 MG tablet Take 1 tablet by mouth daily. 90 tablet 3  . levothyroxine (SYNTHROID, LEVOTHROID) 200 MCG tablet Take 200 mcg by mouth daily  before breakfast.    . methylPREDNISolone (MEDROL) 4 MG TBPK tablet Take as directed 21 tablet 0  . PARoxetine (PAXIL) 20 MG tablet Take 2 tablets (40 mg total) by mouth daily. 60 tablet 3  . Vitamin D, Ergocalciferol, (DRISDOL) 1.25 MG (50000 UT) CAPS capsule Take 1 capsule (50,000 Units total) by mouth every 7 (seven) days. 30 capsule 2   No current facility-administered medications on file prior to visit.     Allergies  Allergen Reactions  . Erythromycin Palpitations    Objective:   General:  Alert and oriented x 3, in no acute distress  Dermatology: Skin is warm, dry, and supple bilateral. Nails are within normal limits. There is no lower extremity erythema, no eccymosis, no open lesions present bilateral.   Vascular: Dorsalis Pedis and Posterior Tibial pedal pulses are 2/4 bilateral. + hair growth noted bilateral. Capillary Fill Time is 3 seconds in all digits. No varicosities, No edema bilateral lower extremities.   Neurological: Sensation grossly intact to light touch.  Musculoskeletal: There is continued tenderness to palpation at the medial calcaneal tubercale and through the insertion of the plantar fascia on the right foot. No pain with compression to calcaneus or application of tuning fork. There is decreased Ankle joint range of motion bilateral. All other joints range of motion  within normal limits bilateral. Strength 5/5 bilateral.  Unchanged lesser hammertoe that is currently asymptomatic.  Assessment and Plan: Problem List Items Addressed This Visit    None    Visit Diagnoses    Plantar fasciitis of right foot    -  Primary   Relevant Medications   meloxicam (MOBIC) 15 MG tablet   traMADol (ULTRAM) 50 MG tablet   Pain of right heel          -Complete examination performed.  -Previous x-rays reviewed. -Re-Discussed with patient in detail the condition of plantar fasciitis, how this  occurs related to the foot type of the patient and general treatment  options. -Prescribed meloxicam to use in the morning and tramadol to use at the end of the day for more severe pain -Order MRI for further evaluation since patient is not responding to any conservative treatment at this time to rule out any potential worsening of the fascial pain or tear -Continue with night splint and gentle stretching as tolerated, icing and cam boot as dispensed at today's visit -Patient to return to office after MRI or sooner if issues arise.  Asencion Islamitorya Jawann Urbani, DPM

## 2018-07-27 ENCOUNTER — Telehealth: Payer: Self-pay | Admitting: *Deleted

## 2018-07-27 DIAGNOSIS — M79671 Pain in right foot: Secondary | ICD-10-CM

## 2018-07-27 NOTE — Telephone Encounter (Signed)
Orders to J. Quintana, RN for pre-cert, faxed to [facility]. 

## 2018-07-27 NOTE — Telephone Encounter (Signed)
-----   Message from Asencion Islam, [place] sent at 07/26/2018  3:58 PM EST ----- Regarding: MRI R heel Worsening plantar fascial pain

## 2018-07-31 ENCOUNTER — Other Ambulatory Visit: Payer: 59

## 2018-08-03 ENCOUNTER — Encounter: Payer: Self-pay | Admitting: Neurology

## 2018-08-03 ENCOUNTER — Ambulatory Visit: Payer: 59 | Admitting: Neurology

## 2018-08-03 ENCOUNTER — Encounter: Payer: Self-pay | Admitting: Sports Medicine

## 2018-08-03 VITALS — BP 156/78 | HR 74 | Ht 62.0 in | Wt 163.0 lb

## 2018-08-03 DIAGNOSIS — E663 Overweight: Secondary | ICD-10-CM | POA: Diagnosis not present

## 2018-08-03 DIAGNOSIS — R413 Other amnesia: Secondary | ICD-10-CM | POA: Diagnosis not present

## 2018-08-03 DIAGNOSIS — E559 Vitamin D deficiency, unspecified: Secondary | ICD-10-CM | POA: Diagnosis not present

## 2018-08-03 DIAGNOSIS — R6889 Other general symptoms and signs: Secondary | ICD-10-CM

## 2018-08-03 DIAGNOSIS — E538 Deficiency of other specified B group vitamins: Secondary | ICD-10-CM | POA: Diagnosis not present

## 2018-08-03 NOTE — Patient Instructions (Signed)
You have complaints of memory loss: memory loss or changes in cognitive function can have many reasons and does not always mean you have dementia. Conditions that can contribute to subjective or objective memory loss include: depression, stress, poor sleep from insomnia or sleep apnea, dehydration, fluctuation in blood sugar values, thyroid or electrolyte dysfunction and certain vitamin deficiencies. Dementia can be caused by stroke, brain atherosclerosis or brain vascular disease due to vascular risk factors (smoking, high blood pressure, high cholesterol, obesity and uncontrolled diabetes), certain degenerative brain disorders (including Parkinson's disease and Multiple sclerosis) and by Alzheimer's disease or other, more rare and sometimes hereditary causes. We will do some additional testing: blood work (which has been done recently already) and we will do a brain scan. We will not start medication as yet. We will also request a formal cognitive test called neuropsychological evaluation which is done by a licensed neuropsychologist. We will make a referral in that regard.  We will do an EEG (brainwave test), which we will schedule. We will call you with the results. We will call you with brain scan test results and monitor your symptoms. Your memory loss is rather mild at this point, which, of course is reassuring.  Please continue with your vitamin D and B12 supplementations. You may even benefit from B12 injections, please talk to Dr. [name] about it.  Try to hydrate well with water.

## 2018-08-03 NOTE — Progress Notes (Signed)
Subjective:    Patient ID: Renee [name] is a 63 y.o. female.  HPI     Renee Foley, MD, PhD [facility] [address], [address] [address] [place], [place] [address]  Dear Dr. Doreene [name],   I saw your patient, Renee [name], upon your kind request in my neurologic clinic today for initial consultation of her memory loss. The patient is unaccompanied today. As you know, Renee [name] is a 63 year old right-handed woman with an underlying medical history of obesity with status post gastric bypass, diabetes, depression, anemia, arthritis, hypothyroidism, hyperlipidemia and osteopenia, who reports short-term memory issues for the past year. She feels like she has lapses in memory, she has had some issues driving. She reports that she has gotten lost while driving. She feels like she has concentration issues. I reviewed your office note from 05/30/2018. She had a prior brain MRI with and without contrast several years ago on 09/28/2011 and I reviewed the results: IMPRESSION: Mild nonspecific white matter signal abnormality right frontotemporal subcortical region.    One consideration given the patient's right-sided headache would be complicated migraine. Correlate clinically. She had recent extensive blood work through your office on 05/30/2018 and I reviewed the results. She had TSH, magnesium, hepatitis C screen, B12, lipid panel, vitamin D, iron panel, CMP and CBC, and HIV as well as urinalysis. Lab results and urinalysis were mostly benign with the exception of low vitamin D at 12 and low vitamin B12 at 102. She has no telltale family history of dementia. She as a older half-sister was diagnosed with Alzheimer's disease a couple of years ago, she is in her 73s. She does not have much knowledge about her father's side of the history. She reports heart disease and alcoholism on both sides of the family. She worries about missing deadlines and has had a stressful job. She is going to go  part-time in February 2020. Sometimes she has no recollections of sending or receiving emails or of conversations she had at work. She has felt disoriented while driving for brief moments. She has one daughter who is 31 years old and her family including daughter and husband have apparently voiced concern about her memory issues. She does not sleep very well and has a long-standing history of insomnia. She denies snoring. She tries to be in bed around 9:30 and rise time is between 7 and 7:30. She reports a history of drug addiction in the family as well. Mom died o complications of drug addiction per patient's report, she believes in her 30s. She has been on oral vitamin D and B12 supplementation. She indicates worsening depressive symptoms in November 2019. She reports that things improved after she started her B12 and vitamin D. She drinks decaf tea. She drinks some coffee in the morning and at lunch, typically have caff. She does not typically drink any alcohol, what with her family history. For sleep, she has tried melatonin but is currently not on it.  Her Past Medical History Is Significant For: Past Medical History:  Diagnosis Date  . Anemia    hx  . Arthritis   . Depression   . DM type 2 (diabetes mellitus, type 2) (HCC)   . Gastric bypass status for obesity    h/o gastric bypass surgery followed by lap band  . Hypercholesterolemia   . Hypothyroidism   . Mood swings   . Osteopenia    on BMD in 07/2012, repeat in 2 yrs    Her Past  Surgical History Is Significant For: Past Surgical History:  Procedure Laterality Date  . bunions Bilateral 08  . CESAREAN SECTION     31 yrs ago  . CHOLECYSTECTOMY    . COLONOSCOPY    . GASTRIC BYPASS  02  . HAMMER TOE SURGERY Bilateral 08  . HIP FRACTURE SURGERY Right 29 yrs ago   screws  . KNEE ARTHROSCOPY WITH ANTERIOR CRUCIATE LIGAMENT (ACL) REPAIR Right    28 yrs ago  . LAPAROSCOPIC GASTRIC BANDING  11  . 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  SHOULDER ARTHROSCOPY WITH SUBACROMIAL DECOMPRESSION AND OPEN ROTATOR C Right    28 yrs ago  . TOTAL HIP ARTHROPLASTY Right 10/04/2015   Procedure: TOTAL RIGHT HIP ARTHROPLASTY;  Surgeon: Frederico Hammananiel Caffrey, MD;  Location: [facility] OR;  Service: Orthopedics;  Laterality: Right;    Her Family History Is Significant For: Family History  Problem Relation Age of Onset  . Depression Mother   . Drug abuse Mother   . Early death Mother   . Heart attack Mother   . Hyperlipidemia Mother   . Hypertension Mother   . Mental illness Mother   . Heart attack Father   . Alcohol abuse Father   . Arthritis Father   . Heart disease Father   . Hyperlipidemia Father   . Hypertension Father   . Alcohol abuse Sister   . Diabetes Sister   . Drug abuse Sister   . Hyperlipidemia Sister   . Hypertension Sister   . Arthritis Daughter   . Diabetes Daughter   . Hyperlipidemia Daughter   . Hypertension Daughter   . Miscarriages / [place] Daughter   . Arthritis Maternal Grandmother   . Hearing loss Maternal Grandmother   . Heart disease Maternal Grandmother   . Hypertension Maternal Grandmother   . Mental illness Sister     Her Social History Is Significant For: Social History   Socioeconomic History  . Marital status: Married    Spouse name: Not on file  . Number of children: Not on file  . Years of education: Not on file  . Highest education level: Not on file  Occupational History  . Not on file  Social Needs  . Financial resource strain: Not on file  . Food insecurity:    Worry: Not on file    Inability: Not on file  . Transportation needs:    Medical: Not on file    Non-medical: Not on file  Tobacco Use  . Smoking status: Never Smoker  . Smokeless tobacco: Never Used  Substance and Sexual Activity  . Alcohol use: No  . Drug use: No  . Sexual activity: Yes    Comment: 1st intercourse 63 yo-Fewer than 5 partners  Lifestyle  . Physical activity:    Days per week: Not on file    Minutes  per session: Not on file  . Stress: Not on file  Relationships  . Social connections:    Talks on phone: Not on file    Gets together: Not on file    Attends religious service: Not on file    Active member of club or organization: Not on file    Attends meetings of clubs or organizations: Not on file    Relationship status: Not on file  Other Topics Concern  . Not on file  Social History Narrative  . Not on file    Her Allergies Are:  Allergies  Allergen Reactions  . Erythromycin Palpitations  :  Her Current Medications Are:  Outpatient Encounter Medications as of 08/03/2018  Medication Sig  . Apoaequorin (PREVAGEN PO) Take by mouth.  . B Complex Vitamins (B COMPLEX-B12) TABS Take one packet daily  . buPROPion (WELLBUTRIN XL) 150 MG 24 hr tablet Take 1 tablet (150 mg total) by mouth daily.  . cholecalciferol (VITAMIN D3) 25 MCG (1000 UT) tablet Take 1,000 Units by mouth daily.  Marland Kitchen estrogen, conjugated,-medroxyprogesterone (PREMPRO) 0.45-1.5 MG tablet Take 1 tablet by mouth daily.  Marland Kitchen levothyroxine (SYNTHROID, LEVOTHROID) 200 MCG tablet Take 200 mcg by mouth daily before breakfast.  . meloxicam (MOBIC) 15 MG tablet Take 1 tablet (15 mg total) by mouth daily.  Marland Kitchen PARoxetine (PAXIL) 20 MG tablet Take 2 tablets (40 mg total) by mouth daily.  . Vitamin D, Ergocalciferol, (DRISDOL) 1.25 MG (50000 UT) CAPS capsule Take 1 capsule (50,000 Units total) by mouth every 7 (seven) days.  . [DISCONTINUED] methylPREDNISolone (MEDROL) 4 MG TBPK tablet Take as directed   No facility-administered encounter medications on file as of 08/03/2018.   :   Review of Systems:  Out of a complete 14 point review of systems, all are reviewed and negative with the exception of these symptoms as listed below:  Review of Systems  Neurological:       Pt presents today to discuss her memory loss. Pt has noticed that she will lose hours in the day where she can't remember what she did. Pt forgot to turn on the  shower one day when she went to work. Pt feels like her memory is getting progressively worse.    Objective:  Neurological Exam  Physical Exam Physical Examination:   Vitals:   08/03/18 0959  BP: (!) 156/78  Pulse: 74    General Examination: The patient is a very pleasant 63 y.o. female in no acute distress. She appears well-developed and well-nourished and well groomed. Mildly anxious appearing.   HEENT: Normocephalic, atraumatic, pupils are equal, round and reactive to light and accommodation. Corrective eyeglasses in place. l with sharp disc margins noted. Extraocular tracking is good without limitation to gaze excursion or nystagmus noted. Normal smooth pursuit is noted. Hearing is grossly intact. Face is symmetric with normal facial animation and normal facial sensation. Speech is clear with no dysarthria noted. There is no hypophonia. There is no lip, neck/head, jaw or voice tremor. Neck is supple with full range of passive and active motion. There are no carotid bruits on auscultation. Oropharynx exam reveals: mild mouth dryness, adequate dental hygiene no significant airway crowding. Tongue protrudes centrally and palate elevates symmetrically.    Chest: Clear to auscultation without wheezing, rhonchi or crackles noted.  Heart: S1+S2+0, regular and normal without murmurs, rubs or gallops noted.   Abdomen: Soft, non-tender and non-distended with normal bowel sounds appreciated on auscultation.  Extremities: There is no pitting edema in the left leg. R foot in a boot.   Skin: Warm and dry without trophic changes noted.  Musculoskeletal: exam reveals no obvious joint deformities, tenderness or joint swelling or erythema.   Neurologically:  Mental status: The patient is awake, alert and oriented in all 4 spheres. Her immediate and remote memory, attention, language skills and fund of knowledge are fairly appropriate. There is no evidence of aphasia, agnosia, apraxia or anomia.  Speech is clear with normal prosody and enunciation. Thought process is linear. Mood is decreased range and affect is blunted.   On 08/03/2018: MMSE: 28/30 (lost 2 points on serial 7s), CDT 3/4 (incorrect time  requested), AFT: 12/min.   Cranial nerves II - XII are as described above under HEENT exam. In addition: shoulder shrug is normal with equal shoulder height noted. Motor exam: Normal bulk, strength and tone is noted. There is no drift, tremor or rebound. Romberg is negative. Reflexes are 2+ throughout, R ankle not tested. Fine motor skills and coordination: intact with normal finger taps, normal hand movements, normal rapid alternating patting, normal foot taps and normal foot agility.  Cerebellar testing: No dysmetria or intention tremor on finger to nose testing. Heel to shin is not testable.   Sensory exam: intact to light touch.  Gait, station and balance: She stands easily. No veering to one side is noted. No leaning to one side is noted. Posture is age-appropriate and stance is narrow based. Gait shows normal stride length and normal pace, but slight limp on the R d/t boot.                Assessment and Plan:    In summary, HERMENIA WHITNER is a very pleasant 63 y.o.-year old female with an underlying medical history of obesity with status post gastric bypass, diabetes, depression, anemia, arthritis, hypothyroidism, hyperlipidemia and osteopenia, who presents for evaluation of her memory loss of about 1 years duration. She has had issues with short-term memory, recall, concentration, and feels she has had lapses in memory. She has had some confusion or disorientation especially while driving. On examination, she has mildly abnormal memory scores. She has a history of depression and has been on medication for this. She had a flareup in her depression in or around November 2019. At that time she was also found to have low vitamin D and B12. I would favor that she gets supplementation for these  medicines, she has been on oral supplementation. She may be a candidate for B12 injections as well. She is advised to discuss this with your office. Furthermore, she may not always sleep well. She has had some stress at work. All of these could be contributors. From my end of things, I did not suggest any new medications but did suggest a few things today. I would like to proceed with a brain MRI with and without contrast. She had extensive blood work recently and I did not suggest adding anything new at this point. I would like to proceed with an EEG and formal cognitive testing with the help of a neuropsychologist. She is agreeable and I made a referral in that regard. I don't believe we can make a diagnosis of dementia, differential diagnosis does include mild cognitive impairment, suboptimally treated depression, sleep disorder with lack of sleep, and metabolic disturbances such as low vitamin D and vitamin B12 deficiency.  I had a long chat with the patient about my findings and the diagnosis of memory loss and dementia, its prognosis and treatment options. We talked about medical treatments and non-pharmacological approaches. We talked about maintaining a healthy lifestyle in general and staying active mentally and physically. I encouraged the patient to eat healthy, exercise daily and keep well hydrated, to keep a scheduled bedtime and wake time routine, to not skip any meals and eat healthy snacks in between meals and to have protein with every meal. I stressed the importance of regular exercise. I plan to see her back after her referral and testing are completed. We will keep her posted as to her EEG and MRI results in the interim by phone call. I did advise her that because of instances  of getting confused while driving or disorientation while driving, she may not be fully safe to drive. She may have to scale back or have someone drive her to and back from work. She demonstrated understanding. She is  going to scale back to part-time which may help reduce her stressors.  I answered all her questions today and she was in agreement.  Thank you very much for allowing me to participate in the care of this nice patient. If I can be of any further assistance to you please do not hesitate to call me at [phone].  Sincerely,   Renee FoleySaima Kelissa Merlin, MD, PhD

## 2018-08-04 ENCOUNTER — Telehealth: Payer: Self-pay | Admitting: Neurology

## 2018-08-04 NOTE — Telephone Encounter (Signed)
MR Brain w/wo contrast Dr. Frances Furbish [facility] Auth: [id number] (exp. 08/03/18 to 09/17/18). Patient is scheduled at [facility] for 08/17/18

## 2018-08-16 ENCOUNTER — Encounter: Payer: Self-pay | Admitting: Sports Medicine

## 2018-08-16 ENCOUNTER — Encounter: Payer: Self-pay | Admitting: Psychology

## 2018-08-16 ENCOUNTER — Ambulatory Visit: Payer: 59 | Admitting: Sports Medicine

## 2018-08-16 DIAGNOSIS — M722 Plantar fascial fibromatosis: Secondary | ICD-10-CM | POA: Diagnosis not present

## 2018-08-16 DIAGNOSIS — M629 Disorder of muscle, unspecified: Secondary | ICD-10-CM

## 2018-08-16 DIAGNOSIS — M79671 Pain in right foot: Secondary | ICD-10-CM

## 2018-08-16 MED ORDER — TRIAMCINOLONE ACETONIDE 10 MG/ML IJ SUSP
10.0000 mg | Freq: Once | INTRAMUSCULAR | Status: AC
  Administered 2018-08-16: 10 mg

## 2018-08-16 NOTE — Progress Notes (Signed)
Subjective: Renee [name] is a 63 y.o. female returns to office for follow up evaluation after Right heel injection for plantar fasciitis, injection #1 administered 6 weeks ago. Patient states that pain is back to where it was; pretty severe, throbbing even when sitting.  Patient denies any increased warmth redness swelling to her heel or any other constitutional symptoms at this time.  Patient denies any recent changes in medications or new problems since last visit.   Patient Active Problem List   Diagnosis Date Noted  . Healthcare maintenance 05/30/2018  . Muscle cramp 05/30/2018  . Severe episode of recurrent major depressive disorder, without psychotic features (HCC) 05/30/2018  . Memory disorder 05/30/2018  . Hypothyroidism 05/30/2018  . Post-traumatic osteoarthritis of right hip 10/04/2015  . Iron deficiency anemia 06/27/2015  . B12 deficiency 06/27/2015  . Vitamin D deficiency 06/27/2015  . Complications of gastric bypass surgery 06/27/2015  . Intestinal malabsorption following gastrectomy 06/27/2015    Current Outpatient Medications on File Prior to Visit  Medication Sig Dispense Refill  . Apoaequorin (PREVAGEN PO) Take by mouth.    . B Complex Vitamins (B COMPLEX-B12) TABS Take one packet daily 90 each 1  . buPROPion (WELLBUTRIN XL) 150 MG 24 hr tablet Take 1 tablet (150 mg total) by mouth daily. 30 tablet 3  . cholecalciferol (VITAMIN D3) 25 MCG (1000 UT) tablet Take 1,000 Units by mouth daily.    Marland Kitchen estrogen, conjugated,-medroxyprogesterone (PREMPRO) 0.45-1.5 MG tablet Take 1 tablet by mouth daily. 90 tablet 3  . levothyroxine (SYNTHROID, LEVOTHROID) 200 MCG tablet Take 200 mcg by mouth daily before breakfast.    . meloxicam (MOBIC) 15 MG tablet Take 1 tablet (15 mg total) by mouth daily. 30 tablet 0  . PARoxetine (PAXIL) 20 MG tablet Take 2 tablets (40 mg total) by mouth daily. 60 tablet 3  . Vitamin D, Ergocalciferol, (DRISDOL) 1.25 MG (50000 UT) CAPS capsule Take 1  capsule (50,000 Units total) by mouth every 7 (seven) days. 30 capsule 2   No current facility-administered medications on file prior to visit.     Allergies  Allergen Reactions  . Erythromycin Palpitations    Objective:   General:  Alert and oriented x 3, in no acute distress  Dermatology: Skin is warm, dry, and supple bilateral. Nails are within normal limits. There is no lower extremity erythema, no eccymosis, no open lesions present bilateral.   Vascular: Dorsalis Pedis and Posterior Tibial pedal pulses are 2/4 bilateral. + hair growth noted bilateral. Capillary Fill Time is 3 seconds in all digits. No varicosities, No edema bilateral lower extremities.   Neurological: Sensation grossly intact to light touch.  Musculoskeletal: There is continued tenderness to palpation at the medial calcaneal tubercale and through the insertion of the plantar fascia on the right foot. No pain with compression to calcaneus or application of tuning fork. There is decreased Ankle joint range of motion bilateral. All other joints range of motion  within normal limits bilateral. Strength 5/5 bilateral.  Unchanged lesser hammertoe that is currently asymptomatic.  Assessment and Plan: Problem List Items Addressed This Visit    None    Visit Diagnoses    Plantar fasciitis of right foot    -  Primary   Relevant Medications   triamcinolone acetonide (KENALOG) 10 MG/ML injection 10 mg (Completed)   Pain of right heel       Nontraumatic tear of plantar fascia          -Complete examination performed.  -Previous  x-rays reviewed. -Re-Discussed with patient in detail the condition of plantar fasciitis, how this  occurs related to the foot type of the patient and general treatment options with concerns of tear vs autoimmune condition -After oral consent and aseptic prep, injected a mixture containing 1 ml of 2%  plain lidocaine, 1 ml 0.5% plain marcaine, 0.5 ml of kenalog 10 and 0.5 ml of dexamethasone  phosphate into R heel at plantar fascia via plantar approach without complication. Post-injection care discussed with patient. This is injection #2 to site. -Continue with Mobic PRN -Will re-order MRI for further evaluation since patient is not responding to any conservative treatment at this time to rule out any potential worsening of the fascial pain or tear in 2 weeks since previous denial of MRI -Continue with night splint and gentle stretching as tolerated, icing and cam boot -Patient to return to office in 3 weeks or sooner if issues arise.  Asencion Islam, DPM

## 2018-08-17 ENCOUNTER — Ambulatory Visit (INDEPENDENT_AMBULATORY_CARE_PROVIDER_SITE_OTHER): Payer: 59

## 2018-08-17 DIAGNOSIS — E559 Vitamin D deficiency, unspecified: Secondary | ICD-10-CM

## 2018-08-17 DIAGNOSIS — E663 Overweight: Secondary | ICD-10-CM | POA: Diagnosis not present

## 2018-08-17 DIAGNOSIS — R6889 Other general symptoms and signs: Secondary | ICD-10-CM

## 2018-08-17 DIAGNOSIS — E538 Deficiency of other specified B group vitamins: Secondary | ICD-10-CM

## 2018-08-17 DIAGNOSIS — R413 Other amnesia: Secondary | ICD-10-CM

## 2018-08-17 MED ORDER — GADOBENATE DIMEGLUMINE 529 MG/ML IV SOLN
15.0000 mL | Freq: Once | INTRAVENOUS | Status: AC | PRN
  Administered 2018-08-17: 15 mL via INTRAVENOUS

## 2018-08-19 NOTE — Progress Notes (Signed)
Please call patient regarding the recent brain MRI: The brain scan showed a normal structure of the brain and no signif. volume loss which we call atrophy. There were changes in the deeper structures of the brain, which we call white matter changes or microvascular changes. These were reported as minimal in Her case. These are tiny white spots, that occur with time and are seen in a variety of conditions, including with normal aging, chronic hypertension, chronic headaches, especially migraine HAs, chronic diabetes, chronic hyperlipidemia. These are not strokes and no mass or lesion or contrast enhancement was seen which is reassuring. Again, there were no acute findings, such as a stroke, or mass or blood products. Overall, fairly age-appropriate findings.  No further action is required on this test at this time, other than re-enforcing the importance of good blood pressure control, good cholesterol control, good blood sugar control, and weight management. Please remind patient to keep any upcoming appointments or tests and to call us with any interim questions, concerns, problems or updates. Thanks,  [name], MD, PhD

## 2018-08-22 ENCOUNTER — Telehealth: Payer: Self-pay

## 2018-08-22 NOTE — Telephone Encounter (Signed)
-----   Message from [name], MD sent at 08/19/2018  9:37 AM EST ----- Please call patient regarding the recent brain MRI: The brain scan showed a normal structure of the brain and no signif. volume loss which we call atrophy. There were changes in the deeper structures of the brain, which we call white matter changes or microvascular changes. These were reported as minimal in Her case. These are tiny white spots, that occur with time and are seen in a variety of conditions, including with normal aging, chronic hypertension, chronic headaches, especially migraine HAs, chronic diabetes, chronic hyperlipidemia. These are not strokes and no mass or lesion or contrast enhancement was seen which is reassuring. Again, there were no acute findings, such as a stroke, or mass or blood products. Overall, fairly age-appropriate findings.  No further action is required on this test at this time, other than re-enforcing the importance of good blood pressure control, good cholesterol control, good blood sugar control, and weight management. Please remind patient to keep any upcoming appointments or tests and to call us with any interim questions, concerns, problems or updates. Thanks,  [name], MD, PhD

## 2018-08-22 NOTE — Telephone Encounter (Signed)
I called pt and discussed her MRI results. Pt will continue with the EEG and neuro-psych testing. Pt verbalized understanding of results. Pt had no questions at this time but was encouraged to call back if questions arise.

## 2018-08-26 ENCOUNTER — Encounter: Payer: Self-pay | Admitting: Family Medicine

## 2018-08-26 ENCOUNTER — Ambulatory Visit (INDEPENDENT_AMBULATORY_CARE_PROVIDER_SITE_OTHER): Payer: 59 | Admitting: Family Medicine

## 2018-08-26 VITALS — BP 130/80 | HR 80 | Temp 98.2°F | Ht 62.0 in | Wt 149.0 lb

## 2018-08-26 DIAGNOSIS — Z1211 Encounter for screening for malignant neoplasm of colon: Secondary | ICD-10-CM | POA: Diagnosis not present

## 2018-08-26 DIAGNOSIS — E559 Vitamin D deficiency, unspecified: Secondary | ICD-10-CM

## 2018-08-26 DIAGNOSIS — E538 Deficiency of other specified B group vitamins: Secondary | ICD-10-CM | POA: Diagnosis not present

## 2018-08-26 DIAGNOSIS — R7309 Other abnormal glucose: Secondary | ICD-10-CM | POA: Diagnosis not present

## 2018-08-26 DIAGNOSIS — D508 Other iron deficiency anemias: Secondary | ICD-10-CM

## 2018-08-26 DIAGNOSIS — Z23 Encounter for immunization: Secondary | ICD-10-CM | POA: Diagnosis not present

## 2018-08-26 DIAGNOSIS — E039 Hypothyroidism, unspecified: Secondary | ICD-10-CM

## 2018-08-26 NOTE — Progress Notes (Addendum)
Established Patient Office Visit  Subjective:  Patient ID: Renee [name], female    DOB: Nov 20, 1955  Age: 63 y.o. MRN: [id number]  CC:  Chief Complaint  Patient presents with  . Annual Exam    HPI Renee [name] presents for follow-up of her B12 and vitamin D deficiency.  She is feeling so much better since she started supplementation.  She has decided to take a leave of absence from her job and return on a part-time basis.  She is interested in having a colonoscopy.  He did seek neurology consultation for her memory issues.  Neurology has ordered an MRI and EEG.  They suspect that her vitamin D and B12 deficiency may have played a role.  Patient actually went skydiving.  She is under treatment by podiatry or Achilles tendinitis.  Continues thyroid hormone for hypothyroidism.  Past Medical History:  Diagnosis Date  . Anemia    hx  . Arthritis   . Depression   . DM type 2 (diabetes mellitus, type 2) ([place])   . Gastric bypass status for obesity    h/o gastric bypass surgery followed by lap band  . Hypercholesterolemia   . Hypothyroidism   . Mood swings   . Osteopenia    on BMD in 07/2012, repeat in 2 yrs    Past Surgical History:  Procedure Laterality Date  . bunions Bilateral 08  . CESAREAN SECTION     31 yrs ago  . CHOLECYSTECTOMY    . COLONOSCOPY    . GASTRIC BYPASS  02  . HAMMER TOE SURGERY Bilateral 08  . HIP FRACTURE SURGERY Right 29 yrs ago   screws  . KNEE ARTHROSCOPY WITH ANTERIOR CRUCIATE LIGAMENT (ACL) REPAIR Right    28 yrs ago  . LAPAROSCOPIC GASTRIC BANDING  11  . 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 SHOULDER ARTHROSCOPY WITH SUBACROMIAL DECOMPRESSION AND OPEN ROTATOR C Right    28 yrs ago  . TOTAL HIP ARTHROPLASTY Right 10/04/2015   Procedure: TOTAL RIGHT HIP ARTHROPLASTY;  Surgeon: Earlie Server, MD;  Location: [facility];  Service: Orthopedics;  Laterality: Right;    Family History  Problem Relation Age of Onset  . Depression Mother   . Drug abuse  Mother   . Early death Mother   . Heart attack Mother   . Hyperlipidemia Mother   . Hypertension Mother   . Mental illness Mother   . Heart attack Father   . Alcohol abuse Father   . Arthritis Father   . Heart disease Father   . Hyperlipidemia Father   . Hypertension Father   . Alcohol abuse Sister   . Diabetes Sister   . Drug abuse Sister   . Hyperlipidemia Sister   . Hypertension Sister   . Arthritis Daughter   . Diabetes Daughter   . Hyperlipidemia Daughter   . Hypertension Daughter   . Miscarriages / [place] Daughter   . Arthritis Maternal Grandmother   . Hearing loss Maternal Grandmother   . Heart disease Maternal Grandmother   . Hypertension Maternal Grandmother   . Mental illness Sister     Social History   Socioeconomic History  . Marital status: Married    Spouse name: Not on file  . Number of children: Not on file  . Years of education: Not on file  . Highest education level: Not on file  Occupational History  . Not on file  Social Needs  . Financial resource strain: Not on  file  . Food insecurity:    Worry: Not on file    Inability: Not on file  . Transportation needs:    Medical: Not on file    Non-medical: Not on file  Tobacco Use  . Smoking status: Never Smoker  . Smokeless tobacco: Never Used  Substance and Sexual Activity  . Alcohol use: No  . Drug use: No  . Sexual activity: Yes    Comment: 1st intercourse 63 yo-Fewer than 5 partners  Lifestyle  . Physical activity:    Days per week: Not on file    Minutes per session: Not on file  . Stress: Not on file  Relationships  . Social connections:    Talks on phone: Not on file    Gets together: Not on file    Attends religious service: Not on file    Active member of club or organization: Not on file    Attends meetings of clubs or organizations: Not on file    Relationship status: Not on file  . Intimate partner violence:    Fear of current or ex partner: Not on file    Emotionally  abused: Not on file    Physically abused: Not on file    Forced sexual activity: Not on file  Other Topics Concern  . Not on file  Social History Narrative  . Not on file    Outpatient Medications Prior to Visit  Medication Sig Dispense Refill  . Apoaequorin (PREVAGEN PO) Take by mouth.    . B Complex Vitamins (B COMPLEX-B12) TABS Take one packet daily 90 each 1  . buPROPion (WELLBUTRIN XL) 150 MG 24 hr tablet Take 1 tablet (150 mg total) by mouth daily. 30 tablet 3  . cholecalciferol (VITAMIN D3) 25 MCG (1000 UT) tablet Take 1,000 Units by mouth daily.    Marland Kitchen estrogen, conjugated,-medroxyprogesterone (PREMPRO) 0.45-1.5 MG tablet Take 1 tablet by mouth daily. 90 tablet 3  . levothyroxine (SYNTHROID, LEVOTHROID) 200 MCG tablet Take 200 mcg by mouth daily before breakfast.    . PARoxetine (PAXIL) 20 MG tablet Take 2 tablets (40 mg total) by mouth daily. 60 tablet 3  . Vitamin D, Ergocalciferol, (DRISDOL) 1.25 MG (50000 UT) CAPS capsule Take 1 capsule (50,000 Units total) by mouth every 7 (seven) days. 30 capsule 2  . meloxicam (MOBIC) 15 MG tablet Take 1 tablet (15 mg total) by mouth daily. 30 tablet 0   No facility-administered medications prior to visit.     Allergies  Allergen Reactions  . Erythromycin Palpitations    ROS Review of Systems  Constitutional: Negative for diaphoresis, fatigue, fever and unexpected weight change.  HENT: Negative.   Eyes: Negative for photophobia and visual disturbance.  Respiratory: Negative.   Cardiovascular: Negative.   Gastrointestinal: Negative.   Endocrine: Negative for polyphagia and polyuria.  Genitourinary: Negative.   Musculoskeletal: Positive for gait problem.  Allergic/Immunologic: Negative for immunocompromised state.  Neurological: Negative for weakness and numbness.  Hematological: Does not bruise/bleed easily.  Psychiatric/Behavioral: Negative.      Depression screen [facility] 2/9 08/26/2018 05/30/2018  Decreased Interest 0 3  Down,  Depressed, Hopeless 0 2  PHQ - 2 Score 0 5  Altered sleeping 1 3  Tired, decreased energy 0 2  Change in appetite 0 1  Feeling bad or failure about yourself  0 1  Trouble concentrating 1 3  Moving slowly or fidgety/restless 0 3  Suicidal thoughts 0 2  PHQ-9 Score 2 20  Difficult doing work/chores  Not difficult at all -      Objective:    Physical Exam  Constitutional: She is oriented to person, place, and time. She appears well-developed and well-nourished. No distress.  HENT:  Head: Normocephalic and atraumatic.  Right Ear: External ear normal.  Left Ear: External ear normal.  Mouth/Throat: Oropharynx is clear and moist. No oropharyngeal exudate.  Eyes: Pupils are equal, round, and reactive to light. Conjunctivae are normal. Right eye exhibits no discharge. Left eye exhibits no discharge. No scleral icterus.  Neck: Neck supple. No JVD present. No tracheal deviation present. No thyromegaly present.  Cardiovascular: Normal rate, regular rhythm and normal heart sounds.  Pulmonary/Chest: Effort normal and breath sounds normal. No stridor.  Lymphadenopathy:    She has no cervical adenopathy.  Neurological: She is alert and oriented to person, place, and time.  Skin: Skin is warm and dry. She is not diaphoretic.  Psychiatric: She has a normal mood and affect. Her behavior is normal.    BP 130/80   Pulse 80   Temp 98.2 F (36.8 C) (Oral)   Ht 5' 2" (1.575 m)   Wt 149 lb (67.6 kg)   SpO2 97%   BMI 27.25 kg/m  Wt Readings from Last 3 Encounters:  08/26/18 149 lb (67.6 kg)  08/03/18 163 lb (73.9 kg)  07/04/18 164 lb (74.4 kg)   BP Readings from Last 3 Encounters:  08/26/18 130/80  08/03/18 (!) 156/78  07/04/18 118/76   Guideline developer:  UpToDate (see UpToDate for funding source) Date Released: June 2014  Health Maintenance Due  Topic Date Due  . COLONOSCOPY  07/23/2005    There are no preventive care reminders to display for this patient.  Lab Results    Component Value Date   TSH 6.30 (H) 08/26/2018   Lab Results  Component Value Date   WBC 11.3 (H) 08/26/2018   HGB 12.7 08/26/2018   HCT 37.4 08/26/2018   MCV 91.7 08/26/2018   PLT 325 08/26/2018   Lab Results  Component Value Date   NA 139 08/26/2018   K 4.8 08/26/2018   CHLORIDE 110 (H) 01/09/2016   CO2 22 08/26/2018   GLUCOSE 94 08/26/2018   BUN 11 08/26/2018   CREATININE 0.74 08/26/2018   BILITOT 0.4 05/30/2018   ALKPHOS 75 05/30/2018   AST 16 05/30/2018   ALT 13 05/30/2018   PROT 6.3 05/30/2018   ALBUMIN 3.9 05/30/2018   CALCIUM 9.5 08/26/2018   ANIONGAP 9 01/09/2016   EGFR 76 (L) 01/09/2016   GFR 79.32 05/30/2018   Lab Results  Component Value Date   CHOL 154 05/30/2018   Lab Results  Component Value Date   HDL 66.80 05/30/2018   Lab Results  Component Value Date   LDLCALC 66 05/30/2018   Lab Results  Component Value Date   TRIG 109.0 05/30/2018   Lab Results  Component Value Date   CHOLHDL 2 05/30/2018   Lab Results  Component Value Date   HGBA1C 5.0 08/26/2018      Assessment & Plan:   Problem List Items Addressed This Visit      Endocrine   Hypothyroidism   Relevant Medications   levothyroxine (SYNTHROID, LEVOTHROID) 25 MCG tablet   Other Relevant Orders   TSH (Completed)     Other   Iron deficiency anemia   Relevant Orders   CBC (Completed)   Iron, TIBC and Ferritin Panel (Completed)   B12 deficiency   Relevant Orders   Vitamin B12 (Completed)  Vitamin D deficiency   Relevant Orders   VITAMIN D 25 Hydroxy (Vit-D Deficiency, Fractures) (Completed)   Screen for colon cancer   Need for diphtheria-tetanus-pertussis (Tdap) vaccine   Relevant Orders   Tdap vaccine greater than or equal to 7yo IM (Completed)   Need for influenza vaccination - Primary   Relevant Orders   Flu Vaccine QUAD 36+ mos IM (Completed)   Elevated glucose   Relevant Orders   Hemoglobin A1c (Completed)   Basic metabolic panel (Completed)      Meds  ordered this encounter  Medications  . levothyroxine (SYNTHROID, LEVOTHROID) 25 MCG tablet    Sig: Take 1 tablet (25 mcg total) by mouth daily before breakfast. To be taken in addition of her 253mg. PLEASE DO NOT CHANGE MANUFACTURERS.    Dispense:  90 tablet    Refill:  0    Follow-up: Return in about 2 months (around 10/25/2018), or if symptoms worsen or fail to improve.

## 2018-08-27 LAB — HEMOGLOBIN A1C
Hgb A1c MFr Bld: 5 % of total Hgb (ref <5.7)
Mean Plasma Glucose: 97 (calc)
eAG (mmol/L): 5.4 (calc)

## 2018-08-27 LAB — CBC
HCT: 37.4 % (ref 35.0–45.0)
Hemoglobin: 12.7 g/dL (ref 11.7–15.5)
MCH: 31.1 pg (ref 27.0–33.0)
MCHC: 34 g/dL (ref 32.0–36.0)
MCV: 91.7 fL (ref 80.0–100.0)
MPV: 10.8 fL (ref 7.5–12.5)
Platelets: 325 10*3/uL (ref 140–400)
RBC: 4.08 10*6/uL (ref 3.80–5.10)
RDW: 13.3 % (ref 11.0–15.0)
WBC: 11.3 10*3/uL — ABNORMAL HIGH (ref 3.8–10.8)

## 2018-08-27 LAB — VITAMIN B12: Vitamin B-12: 1731 pg/mL — ABNORMAL HIGH (ref 200–1100)

## 2018-08-27 LAB — BASIC METABOLIC PANEL
BUN: 11 mg/dL (ref 7–25)
CO2: 22 mmol/L (ref 20–32)
Calcium: 9.5 mg/dL (ref 8.6–10.4)
Chloride: 105 mmol/L (ref 98–110)
Creat: 0.74 mg/dL (ref 0.50–0.99)
Glucose, Bld: 94 mg/dL (ref 65–99)
Potassium: 4.8 mmol/L (ref 3.5–5.3)
Sodium: 139 mmol/L (ref 135–146)

## 2018-08-27 LAB — IRON,TIBC AND FERRITIN PANEL
%SAT: 10 % (calc) — ABNORMAL LOW (ref 16–45)
Ferritin: 21 ng/mL (ref 16–288)
Iron: 45 ug/dL (ref 45–160)
TIBC: 433 mcg/dL (calc) (ref 250–450)

## 2018-08-27 LAB — TSH: TSH: 6.3 mIU/L — ABNORMAL HIGH (ref 0.40–4.50)

## 2018-08-27 LAB — VITAMIN D 25 HYDROXY (VIT D DEFICIENCY, FRACTURES): Vit D, 25-Hydroxy: 16 ng/mL — ABNORMAL LOW (ref 30–100)

## 2018-08-30 ENCOUNTER — Encounter: Payer: Self-pay | Admitting: Sports Medicine

## 2018-08-30 MED ORDER — LEVOTHYROXINE SODIUM 25 MCG PO TABS: 25.0000 ug | ORAL_TABLET | Freq: Every day | ORAL | 0 refills | Status: DC

## 2018-08-30 NOTE — Addendum Note (Signed)
Addended by: Andrez Grime on: 08/30/2018 02:49 PM   Modules accepted: Orders

## 2018-09-05 ENCOUNTER — Ambulatory Visit: Payer: 59 | Admitting: Neurology

## 2018-09-05 ENCOUNTER — Telehealth: Payer: Self-pay | Admitting: *Deleted

## 2018-09-05 DIAGNOSIS — R410 Disorientation, unspecified: Secondary | ICD-10-CM

## 2018-09-05 DIAGNOSIS — E538 Deficiency of other specified B group vitamins: Secondary | ICD-10-CM

## 2018-09-05 DIAGNOSIS — R6889 Other general symptoms and signs: Secondary | ICD-10-CM

## 2018-09-05 DIAGNOSIS — E663 Overweight: Secondary | ICD-10-CM

## 2018-09-05 DIAGNOSIS — E559 Vitamin D deficiency, unspecified: Secondary | ICD-10-CM

## 2018-09-05 DIAGNOSIS — R413 Other amnesia: Secondary | ICD-10-CM

## 2018-09-05 DIAGNOSIS — M79671 Pain in right foot: Secondary | ICD-10-CM

## 2018-09-05 NOTE — Telephone Encounter (Signed)
[employer] - Caryn Bee states may fill an appeal, with formal letter of appeal stating criteria for the MRI has been completed and clinicals, denial [id number], and fax to [phone]. I had previously cancelled Case of 07/2018 MRI, reordered as NEW.

## 2018-09-05 NOTE — Telephone Encounter (Signed)
Dr. Marylene Land ordered MRI right foot after pt has completed 8 weeks of conservative care. Orders to Tomasa Hose, RN for pre-cert, and faxed to [facility].

## 2018-09-05 NOTE — Telephone Encounter (Signed)
-----   Message from [place], [place] sent at 08/16/2018 10:55 PM EST ----- Regarding: Order MRI in 2 weeks Re-order MRI in 2 weeks patient at this point will have failed 8 weeks of conservative care.  Must do MRI to rule out nontraumatic tear of plantar fascia since pain did not improve with conservative care.  If her MRI fails to the approved please notify me so that I can move forward with appealing this decision and possibly doing a peer to peer to discuss with a insurance company physician the need of this MRI -Dr. Marylene Land

## 2018-09-06 ENCOUNTER — Ambulatory Visit: Payer: 59 | Admitting: Sports Medicine

## 2018-09-06 ENCOUNTER — Encounter: Payer: Self-pay | Admitting: Sports Medicine

## 2018-09-06 DIAGNOSIS — M722 Plantar fascial fibromatosis: Secondary | ICD-10-CM | POA: Diagnosis not present

## 2018-09-06 DIAGNOSIS — M79671 Pain in right foot: Secondary | ICD-10-CM | POA: Diagnosis not present

## 2018-09-06 DIAGNOSIS — M629 Disorder of muscle, unspecified: Secondary | ICD-10-CM | POA: Diagnosis not present

## 2018-09-06 MED ORDER — TRAMADOL HCL 50 MG PO TABS: 50.0000 mg | ORAL_TABLET | Freq: Three times a day (TID) | ORAL | 0 refills | Status: DC | PRN

## 2018-09-06 NOTE — Progress Notes (Signed)
Subjective: Renee [name] is a 63 y.o. female returns to office for follow up evaluation after Right heel injection for plantar fasciitis, injection #2 administered 3 weeks ago. Patient states that the last injection made the pain worse and reports now she has stopped working due to the pain cannot even go to her job site due to the pain in her foot.  Patient is becoming frustrated with the fact that her insurance has not approved a MRI for further evaluation for [place] to possibly discuss and plan anything further.  Reports tramadol helps a little.  Patient denies any recent changes in medications or new problems since last visit.   Patient Active Problem List   Diagnosis Date Noted  . Need for diphtheria-tetanus-pertussis (Tdap) vaccine 08/26/2018  . Need for influenza vaccination 08/26/2018  . Elevated glucose 08/26/2018  . Screen for colon cancer 05/30/2018  . Muscle cramp 05/30/2018  . Severe episode of recurrent major depressive disorder, without psychotic features (HCC) 05/30/2018  . Memory disorder 05/30/2018  . Hypothyroidism 05/30/2018  . Post-traumatic osteoarthritis of right hip 10/04/2015  . Iron deficiency anemia 06/27/2015  . B12 deficiency 06/27/2015  . Vitamin D deficiency 06/27/2015  . Complications of gastric bypass surgery 06/27/2015  . Intestinal malabsorption following gastrectomy 06/27/2015    Current Outpatient Medications on File Prior to Visit  Medication Sig Dispense Refill  . Apoaequorin (PREVAGEN PO) Take by mouth.    . B Complex Vitamins (B COMPLEX-B12) TABS Take one packet daily 90 each 1  . buPROPion (WELLBUTRIN XL) 150 MG 24 hr tablet Take 1 tablet (150 mg total) by mouth daily. 30 tablet 3  . cholecalciferol (VITAMIN D3) 25 MCG (1000 UT) tablet Take 1,000 Units by mouth daily.    Marland Kitchen estrogen, conjugated,-medroxyprogesterone (PREMPRO) 0.45-1.5 MG tablet Take 1 tablet by mouth daily. 90 tablet 3  . levothyroxine (SYNTHROID, LEVOTHROID) 200 MCG tablet Take  200 mcg by mouth daily before breakfast.    . levothyroxine (SYNTHROID, LEVOTHROID) 25 MCG tablet Take 1 tablet (25 mcg total) by mouth daily before breakfast. To be taken in addition of her . PLEASE DO NOT CHANGE MANUFACTURERS. 90 tablet 0  . PARoxetine (PAXIL) 20 MG tablet Take 2 tablets (40 mg total) by mouth daily. 60 tablet 3  . Vitamin D, Ergocalciferol, (DRISDOL) 1.25 MG (50000 UT) CAPS capsule Take 1 capsule (50,000 Units total) by mouth every 7 (seven) days. 30 capsule 2   No current facility-administered medications on file prior to visit.     Allergies  Allergen Reactions  . Erythromycin Palpitations    Objective:   General:  Alert and oriented x 3, in no acute distress  Dermatology: Skin is warm, dry, and supple bilateral. Nails are within normal limits. There is no lower extremity erythema, no eccymosis, no open lesions present bilateral.   Vascular: Dorsalis Pedis and Posterior Tibial pedal pulses are 2/4 bilateral. + hair growth noted bilateral. Capillary Fill Time is 3 seconds in all digits. No varicosities, No edema bilateral lower extremities.   Neurological: Sensation grossly intact to light touch.  Musculoskeletal: There is continued tenderness to palpation at the medial calcaneal tubercale and through the insertion of the plantar fascia on the right foot.  Patient cannot bear complete weight and when she flexes her foot down pain she across the plantar fascia and she feels a sharp stabbing pain in her heel. No pain with compression to calcaneus or application of tuning fork. There is decreased Ankle joint range of motion bilateral.  All other joints range of motion  within normal limits bilateral. Strength 5/5 bilateral.  Unchanged lesser hammertoe that is currently asymptomatic.  Assessment and Plan: Problem List Items Addressed This Visit    None    Visit Diagnoses    Plantar fasciitis of right foot    -  Primary   Pain of right heel       Right foot pain        Nontraumatic tear of plantar fascia          -Complete examination performed.  -Re-Discussed with patient in detail the condition of plantar fasciitis, how this  occurs related to the foot type of the patient and general treatment options  -Continue with tramadol PRN -Patient is awaiting MRI to evaluate plantar fascia since pain is very intense and not improved with any attempt of conservative care at this point which is been beyond 8 weeks; MRI is needed for possible likely surgical intervention since patient is not getting better and for surgical planning. -Continue with night splint and gentle stretching as tolerated, icing and cam boot -Patient to return to office after MRI or sooner if issues arise.  Asencion Islam, DPM

## 2018-09-08 ENCOUNTER — Encounter: Payer: Self-pay | Admitting: Family Medicine

## 2018-09-08 NOTE — Procedures (Signed)
   HISTORY: 63 year old female, presented with memory loss  TECHNIQUE:  This is a routine 16 channel EEG recording with one channel devoted to a limited EKG recording.  It was performed during wakefulness, drowsiness and asleep.  Hyperventilation and photic stimulation were performed as activating procedures.  There are minimum muscle and movement artifact noted.  Upon maximum arousal, posterior dominant waking rhythm consistent of rhythmic small amplitude beta range activity. Activities are symmetric over the bilateral posterior derivations and attenuated with eye opening.  Hyperventilation produced mild/moderate buildup with higher amplitude and the slower activities noted.  Photic stimulation did not alter the tracing.  During EEG recording, patient developed drowsiness and no deeper stage of sleep was achieved.  During EEG recording, there was no epileptiform discharge noted.  EKG demonstrate sinus rhythm, with heart rate of 78  CONCLUSION: This is a  normal awake EEG.  There is no electrodiagnostic evidence of epileptiform discharge.  The increased beta range activity could be due to medication side effect.  Levert Feinstein, M.D. Ph.D.  [facility] [address] [place], [place] [address] Phone: [phone] Fax:      [phone]

## 2018-09-09 ENCOUNTER — Encounter: Payer: Self-pay | Admitting: Sports Medicine

## 2018-09-10 ENCOUNTER — Encounter: Payer: Self-pay | Admitting: Neurology

## 2018-09-12 ENCOUNTER — Telehealth: Payer: Self-pay | Admitting: Neurology

## 2018-09-12 NOTE — Progress Notes (Signed)
Please call and advise the patient that the EEG or brain wave test we performed was reported as normal in the awake state. We checked for abnormal electrical discharges in the brain waves and the report suggested normal findings. No further action is required on this test at this time.  Proceed with Neuropsychological consultation as scheduled for April. Please remind pt of the appt.   [name], MD, PhD

## 2018-09-12 NOTE — Telephone Encounter (Signed)
Called the patient and advised her of the normal EEG finding. Advised the patient to continue to keep upcoming apt. Pt questions if that apt in April is necessary since things are coming back ok. Advised the patient that its worth it to keep the apt just to complete the work up. Patient feels a lot of this may be related to stress and she is taking 2 months off from work and plans to journal if there is any change in her stress level or memory during that time off. Pt verbalized understanding of the results.

## 2018-09-12 NOTE — Telephone Encounter (Signed)
-----   Message from [name], MD sent at 09/12/2018  8:14 AM EST ----- Please call and advise the patient that the EEG or brain wave test we performed was reported as normal in the awake state. We checked for abnormal electrical discharges in the brain waves and the report suggested normal findings. No further action is required on this test at this time.  Proceed with Neuropsychological consultation as scheduled for April. Please remind pt of the appt.   [name], MD, PhD

## 2018-09-13 ENCOUNTER — Encounter: Payer: Self-pay | Admitting: Sports Medicine

## 2018-09-19 ENCOUNTER — Telehealth: Payer: Self-pay | Admitting: *Deleted

## 2018-09-19 NOTE — Telephone Encounter (Signed)
Renee [name] - [facility] states pt does not have current PA for MRI scheduled tomorrow.

## 2018-09-20 ENCOUNTER — Inpatient Hospital Stay: Admission: RE | Admit: 2018-09-20 | Payer: 59 | Source: Ambulatory Visit

## 2018-09-20 ENCOUNTER — Encounter: Payer: Self-pay | Admitting: Sports Medicine

## 2018-09-20 ENCOUNTER — Encounter: Payer: Self-pay | Admitting: Family Medicine

## 2018-09-22 ENCOUNTER — Telehealth: Payer: Self-pay | Admitting: *Deleted

## 2018-09-22 NOTE — Telephone Encounter (Signed)
-----   Message from Marylou Mccoy, RN sent at 09/22/2018 10:05 AM EST -----  ----- Message ----- From: Asencion Islam, DPM Sent: 09/21/2018   5:06 PM EST To: Marylou Mccoy, RN  Approval has been granted for MRI [id number] Expires 11/05/2018 ----- Message ----- From: Marylou Mccoy, RN Sent: 09/21/2018   9:46 AM EST To: Marissa Nestle, RN, Asencion Islam, DPM  Dr Marylene Land, I have called to figure out why this MRI was denied. They state that there is not enough clinical information. The appeal will require a peer to peer review. Can you please give them a call  [employer]: [phone], option 3   Case #[id number]   Thank you

## 2018-09-22 NOTE — Telephone Encounter (Signed)
I informed pt, Dr. Marylene Land was able to get PA for the MRI and I would fax to [facility] and she could contact and schedule. Pt thanked me and told me I had made her day.

## 2018-09-24 ENCOUNTER — Ambulatory Visit
Admission: RE | Admit: 2018-09-24 | Discharge: 2018-09-24 | Disposition: A | Discharge status: Home / Self Care | Payer: 59 | Source: Ambulatory Visit | Attending: Sports Medicine | Admitting: Sports Medicine

## 2018-09-24 DIAGNOSIS — M79671 Pain in right foot: Secondary | ICD-10-CM

## 2018-09-26 ENCOUNTER — Encounter: Payer: Self-pay | Admitting: Sports Medicine

## 2018-09-26 NOTE — Telephone Encounter (Signed)
-----   Message from Asencion Islam, [place] sent at 09/26/2018  9:09 AM EDT ----- Will you let patient know that MRI shows inflammation like we suspected of the plantar fascia. NO Tear. There is also inflammation of peroneal tendon (lateral ankle) and strain of flexor muscle (posterior calf). At this time patient will be a good candidate for shock wave therapy (75-80% effective) or for surgery, plantar fascial release (80% effective). For the inflammation at peroneal tendon and muscle strain surgery is not indicated. These changes are usually from compensation. Dr Marylene Land

## 2018-09-26 NOTE — Telephone Encounter (Signed)
I informed pt of Dr. Wynema Birch review of MRI results and recommendations. Pt states understanding and that since she has been on leave she has been using an ice pack that wraps around her foot 15-20 minutes about 3-4 times a day, stretching and resting and taking Aleve, and she feels at least 80% better. Pt states she will be out of work until 11/18/2018 and would like to continue with her conservative therapy, and be reevaluated by Dr. Marylene Land prior to returning to work but not so late in her leave that if she needed surgery it would need to be postponed. I suggested to pt to continue the conservative therapy, Dr. Marylene Land would be very happy and make an appt to see Dr. Marylene Land in 2 weeks. Pt agreed, and I transferred to schedulers.

## 2018-09-26 NOTE — Telephone Encounter (Signed)
Sounds good. Thanks 

## 2018-09-30 ENCOUNTER — Other Ambulatory Visit: Payer: Self-pay

## 2018-09-30 ENCOUNTER — Encounter: Payer: 59 | Attending: Psychology | Admitting: Psychology

## 2018-09-30 ENCOUNTER — Encounter

## 2018-09-30 DIAGNOSIS — R29818 Other symptoms and signs involving the nervous system: Secondary | ICD-10-CM

## 2018-09-30 DIAGNOSIS — R413 Other amnesia: Secondary | ICD-10-CM | POA: Diagnosis present

## 2018-09-30 DIAGNOSIS — R4189 Other symptoms and signs involving cognitive functions and awareness: Secondary | ICD-10-CM

## 2018-10-03 ENCOUNTER — Other Ambulatory Visit: Payer: Self-pay | Admitting: Family Medicine

## 2018-10-06 ENCOUNTER — Encounter: Payer: Self-pay | Admitting: Family Medicine

## 2018-10-06 MED ORDER — PAROXETINE HCL 20 MG PO TABS: 40.0000 mg | ORAL_TABLET | Freq: Every day | ORAL | 3 refills | Status: DC
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
## 2018-10-11 ENCOUNTER — Ambulatory Visit: Payer: 59 | Admitting: Sports Medicine

## 2018-10-17 ENCOUNTER — Other Ambulatory Visit: Payer: Self-pay

## 2018-10-17 ENCOUNTER — Encounter: Payer: Self-pay | Admitting: Psychology

## 2018-10-17 ENCOUNTER — Encounter: Payer: 59 | Admitting: Psychology

## 2018-10-17 DIAGNOSIS — R413 Other amnesia: Secondary | ICD-10-CM | POA: Diagnosis not present

## 2018-10-17 NOTE — Progress Notes (Signed)
Mental Status Examination & Behavior Observations: The patient arrived on time to her 13:00 testing appointment. She was administered the Wechsler Adult Intelligence Scale, 4th Edition (WAIS-IV), and Wechsler Memory Scale, 4th Edition (WMS-IV; Adult Battery). The evaluation lasted 180 minutes. She was alert and fully oriented. She was appropriately dressed and appeared to have good hygiene. She ambulated without difficulty. Her spontaneous speech was clear and prosodic with normal rate, tone, and volume. No word finding difficulties or language errors were noted. Mood was euthymic with appropriate and congruent affect. Thought processes were organized and logical with good abstract reasoning. Insight and Judgement are good.   She was cooperative with all assigned tasks and appeared to give good effort. She did not have any difficulty hearing or understanding test instructions and did not require any additional prompting. She exhibited good frustration tolerance on questions she did not know or tasks that were more difficult.   Results of the WAIS-IV:   Composite Score Summary  Scale Sum of Scaled Scores Composite Score Percentile Rank 95% Conf. Interval Qualitative Description  Verbal Comprehension 43 VCI 125 95 118-130 Superior  Perceptual Reasoning 29 PRI 98 45 92-104 Average  Working Memory 19 WMI 97 42 90-104 Average  Processing Speed 26 PSI 117 87 107-124 High Average  Full Scale 117 FSIQ 111 77 107-115 High Average  General Ability 72 GAI 112 79 107-117 High Average   Index Level Discrepancy Comparisons  Comparison Score 1 Score 2 Difference Critical Value .05 Significant Difference Y/N Base Rate by Overall Sample  VCI - PRI 125 98 27 8.31 Y 2.6  VCI - WMI 125 97 28 8.82 Y 1.8  VCI - PSI 125 117 8 10.19 N 31.5  PRI - WMI 98 97 1 9.74 N 49.0  PRI - PSI 98 117 -19 11.00 Y 10.4  WMI - PSI 97 117 -20 11.38 Y 8.8  FSIQ - GAI 111 112 -1 3.51 N 44.9   Differences Between Subtest and  Overall Mean of Subtest Scores  Subtest Subtest Scaled Score Mean Scaled Score Difference Critical Value .05 Strength or Weakness Base Rate  Digit Span 9 11.70 -2.70 2.22 W 15-25%  Matrix Reasoning 9 11.70 -2.70 2.54 W 15-25%  Vocabulary 16 11.70 4.30 2.03 S 2-5%    Results of the WMS-IV (Adult Battery):   Brief Cognitive Status Exam Classification  Age Years of Education Raw Score Classification Level Base Rate  63 years 2 months 16 53 Average 100.0   Index Score Summary  Index Sum of Scaled Scores Index Score Percentile Rank 95% Confidence Interval Qualitative Descriptor  Auditory Memory (AMI) 56 124 95 117-129 Superior  Visual Memory (VMI) 50 115 84 109-120 High Average  Visual Working Memory (VWMI) 19 97 42 90-104 Average  Immediate Memory (IMI) 50 117 87 110-122 High Average  Delayed Memory (DMI) 56 128 97 120-133 Superior   Primary Subtest Scaled Score Summary  Subtest Domain Raw Score Scaled Score Percentile Rank  Logical Memory I AM 34 13 84  Logical Memory II AM 34 15 95  Verbal Paired Associates I AM 41 13 84  Verbal Paired Associates II AM 14 15 95  Designs I VM 74 12 75  Designs II VM 64 13 84  Visual Reproduction I VM 38 12 75  Visual Reproduction II VM 33 13 84  Spatial Addition VWM 9 8 25   Symbol Span VWM 23 11 63    Auditory Memory Process Score Summary  Process Score Raw Score Scaled  Score Percentile Rank Cumulative Percentage (Base Rate)  LM II Recognition 27 - - >75%  VPA II Recognition 40 - - >75%   Visual Memory Process Score Summary  Process Score Raw Score Scaled Score Percentile Rank Cumulative Percentage (Base Rate)  DE I Content 34 10 50 -  DE I Spatial 18 11 63 -  DE II Content 38 13 84 -  DE II Spatial 14 12 75 -  DE II Recognition 19 - - >75%  VR II Recognition 7 - - >75%

## 2018-10-25 ENCOUNTER — Encounter: Payer: 59 | Admitting: Psychology

## 2018-10-25 ENCOUNTER — Encounter: Payer: 59 | Attending: Psychology | Admitting: Psychology

## 2018-10-25 ENCOUNTER — Other Ambulatory Visit: Payer: Self-pay

## 2018-10-25 ENCOUNTER — Encounter: Payer: Self-pay | Admitting: Psychology

## 2018-10-25 DIAGNOSIS — R29818 Other symptoms and signs involving the nervous system: Secondary | ICD-10-CM | POA: Diagnosis not present

## 2018-10-25 DIAGNOSIS — R4189 Other symptoms and signs involving cognitive functions and awareness: Secondary | ICD-10-CM

## 2018-10-25 DIAGNOSIS — R413 Other amnesia: Secondary | ICD-10-CM | POA: Insufficient documentation

## 2018-10-25 NOTE — Progress Notes (Signed)
Neuropsychological Evaluation   Patient:  Renee [name]   DOB: 05-Jul-1956  MR Number: [id number]  Location: [facility] PHYSICAL MEDICINE AND REHABILITATION [address] [place], [address] [id number] [facility] [place] [address] Dept: [phone]  Start: 11 AM End: 12 PM  Provider/Observer:     Hershal Coria PsyD  Chief Complaint:      Chief Complaint  Patient presents with   Memory Loss   Other    Geographic disorientation, expressive language changes, executive functioning difficulties.    Reason For Service:     Renee [name] is a 63 year old female referred by Dr. [name], MD for neuropsychological evaluation.  The patient has been followed by her neurologist for issues related to memory loss and deficits in attentiveness.  The patient has had ongoing issues of memory loss, feeling disconnected, geographic disorientation in familiar places, confusion, expressive language deficits related to word retrieval.  The patient identified difficulty starting between 1-1/2 to 2 years ago and these changes were also noted by family and coworkers.  The patient had an MRI conducted on 08/17/2018 with findings showing brain parenchyma showing tiny right frontal subcortical and periventricular white matter hyperintensities which are nonspecific.  Differential considerations suggested that the findings are seen in a variety of conditions including migraine headaches, small vessel disease, vasculitis, demyelinating, autoimmune or inflammatory conditions.  No other structural lesions, tumors or infarcts were noted.  The patient described her word finding issues related bloats giving the wrong words with different meetings but similar sounding" including paraphasic errors.  She described procedural errors such as putting detergent pods in the dryer getting carrots and carrot cake.  She reports that she will forget about meetings.   Geographic disorientation where she will get lost driving to very familiar places.  The patient reports that she has had to call her husband or daughter to get directions when she has gotten lost and disoriented and places that she should know what was very familiar to.  The patient describes being very easily distracted and forget things altogether.  The patient reports that she had severe migraines 35 years ago but she reports that she has not having them now.  The patient reports that she has a significant variation in her difficulties.  The patient reports that on some days she feels like she is "on her game" and other days she has very significant difficulties.  The patient reports that her symptoms were getting worse over time she got to the point where she did not trust herself to drive or cook.  However, she took a 68-month leave of absence 2 weeks of being out of work her symptoms improved to some degree.  The patient reports that she has had significant difficulties will have 1 night where she will sleep well and then 2 days where she sleeps little to none and then will eventually crash and go to sleep.  The patient reports that she will be very sleepy during the early evening but cannot sleep for very long.  The patient reports that she wakes up frequently during the night.  The patient reports that she has a good appetite.  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  The patient denied any significant family history of dementia.  She does have an older half sister that was diagnosed with Alzheimer's  dementia a couple years ago when her sister was in her 80s.  The only surgical interventions that the patient has had through the years include a C-section in 1984, hip replacement surgery in 2017, and gastric bypass surgery for weight loss.  Testing  Administered:  The patient was administered the Wechsler Adult Intelligence Scale-IV as well as the Wechsler Memory Scale-IV.  Participation Level:   Active  Participation Quality:  Appropriate and Attentive      Behavioral Observation:  Well Groomed, Alert, and Appropriate.   The patient was administered this neuropsychological test battery by Thayer Headings, Psy.D. on 09/20/2018.  Below are his behavioral observations derived during the administration of this test battery.  Mental Status Examination & Behavior Observations: The patient arrived on time to her 13:00 testing appointment. She was administered the Wechsler Adult Intelligence Scale, 4th Edition (WAIS-IV), and Wechsler Memory Scale, 4th Edition (WMS-IV; Adult Battery). The evaluation lasted 180 minutes. She was alert and fully oriented. She was appropriately dressed and appeared to have good hygiene. She ambulated without difficulty. Her spontaneous speech was clear and prosodic with normal rate, tone, and volume. No word finding difficulties or language errors were noted. Mood was euthymic with appropriate and congruent affect. Thought processes were organized and logical with good abstract reasoning. Insight and Judgement are good.   She was cooperative with all assigned tasks and appeared to give good effort. She did not have any difficulty hearing or understanding test instructions and did not require any additional prompting. She exhibited good frustration tolerance on questions she did not know or tasks that were more difficult.   Test Results:     Composite Score Summary  Scale Sum of Scaled Scores Composite Score Percentile Rank 95% Conf. Interval Qualitative Description  Verbal Comprehension 43 VCI 125 95 118-130 Superior  Perceptual Reasoning 29 PRI 98 45 92-104 Average  Working Memory 19 WMI 97 42 90-104 Average  Processing Speed 26 PSI 117 87 107-124 High Average  Full Scale 117 FSIQ 111 77 107-115 High Average   General Ability 72 GAI 112 79 107-117 High Average   The patient produced a full scale IQ score of 111, which falls at the 77th percentile and is in the high average range of functioning.  To adjust to issues related to working memory and sensitive measures related to processing speed we also calculated the general abilities index score.  The patient produced a general abilities index score of 112 which falls at the 79th percentile and is also in the high average range of functioning.  All composite scores were in the average to superior range of functioning with the patient's lowest scores related to working memory and visual-spatial/perceptual reasoning abilities both of which fell in the average range.  The patient's best performance area was her verbal comprehension index score which fell at the 95th percentile and was in the superior range.   Verbal Comprehension Subtests Summary  Subtest Raw Score Scaled Score Percentile Rank Reference Group Scaled Score SEM  Similarities 32 14 91 14 1.08  Vocabulary 54 16 98 17 0.73  Information 20 13 84 14 0.67  (Comprehension) 32 15 95 15 1.08     The patient produced a verbal comprehension index score of 125 which falls at the 95th percentile and is in the superior range of functioning.  Individual subtest making up this measure were all quite good and in the high average to superior range of functioning.  The patient produced scores between the 84th  percentile and the 98th percentile respectively for subtest.  The patient did very well with regard to her verbal reasoning and problem-solving abilities, general vocabulary skills, her general fund of information and world knowledge, as well as her social judgment and comprehension abilities.    Perceptual Reasoning Subtests Summary  Subtest Raw Score Scaled Score Percentile Rank Reference Group Scaled Score SEM  Block Design 41 11 63 9 1.04  Matrix Reasoning 14 9 37 7 0.95  Visual Puzzles 11 9 37 7  0.99  (Figure Weights) 15 12 75 10 0.99  (Picture Completion) 14 12 75 10 1.12     Patient produced a perceptual reasoning index score of 98 which falls at the 45th percentile and is in the average range.  Individual items making up this subtest show that all subtest fall in the average range with no significant scatter with regard to subtest scores.  The patient showed average performance with regard to visual analysis and organization, visual reasoning and problem-solving measures, visual estimation and visual judgment abilities, as well as her ability to identify anomalies within a visual gestalt.  These scores were all generally consistent with each other but no more than a three-point standard scale score difference between measures.  There were no indications of significant visual deficits on these measures.  They were, however some of her lower scores relative to her own performance.  Working Librarian, academic Raw Score Scaled Score Percentile Rank Reference Group Scaled Score SEM  Digit Span 25 9 37 8 0.85  Arithmetic 14 10 50 10 1.04  (Letter-Number Seq.) 21 11 63 10 1.08     The patient produced a working memory index score of 97 which falls at the 42nd percentile and is in the average range.  Individual subtest making up this measure were all in the average range with no significant scatter noted on subtest performance.  The patient is auditory encoding abilities, multi processing abilities were all in the average range with no indication of significant deficits on these measures.   Processing Speed Subtests Summary  Subtest Raw Score Scaled Score Percentile Rank Reference Group Scaled Score SEM  Symbol Search 34 12 75 10 1.31  Coding 79 14 91 11 0.99  (Cancellation) 35 9 37 8 1.34   The patient produced a processing speed index score of 117 which falls at the 87th percentile and is in the high average range of functioning.  Individual subtest making up the score  were generally consistent with each other.  The patient showed her best performance with regard to measures of visual scanning and visual searching abilities visually searching both horizontally and vertically.  Measures of pure focus execute abilities or her lower scores but there were still in the average range and these measures combined suggest that the patient's overall information processing speed were all within the average range to high average range of functioning relative to a normative population.   Index Score Summary  Index Sum of Scaled Scores Index Score Percentile Rank 95% Confidence Interval Qualitative Descriptor  Auditory Memory (AMI) 56 124 95 117-129 Superior  Visual Memory (VMI) 50 115 84 109-120 High Average  Visual Working Memory (VWMI) 19 97 42 90-104 Average  Immediate Memory (IMI) 50 117 87 110-122 High Average  Delayed Memory (DMI) 56 128 97 120-133 Superior   The patient was then administered the Wechsler Memory Scale-IV.  The patient produced a visual working memory index score of 97 which falls at the  42nd percentile and is in the average range relative to her normative comparison group.  This performance is consistent with auditory working memory index scores from the Wechsler Adult Intelligence Scale which was also in the average range.  These are essentially identical levels of performance for both auditory and visual encoding abilities.  The patient showed exceptional ability with regard to her immediate memory capacity.  The patient's auditory memory versus visual memory were quite consistent both of which fall and in the high average to superior range of memory functioning relative to a normative comparison group.  The patient also did exceptionally well on delayed memory task both visual and auditory where she produced an index score of 128 which falls at the 97th percentile and is in the superior range.   ABILITY-MEMORY ANALYSIS  Ability Score:  GAI: 112 Date  of Testing:  WAIS-IV; WMS-IV 2018/10/17  Predicted Difference Method   Index Predicted WMS-IV Index Score Actual WMS-IV Index Score Difference Critical Value  Significant Difference Y/N Base Rate  Auditory Memory 106 124 -18 8.95 Y 5-10%  Visual Memory 107 115 -8 8.82 N   Visual Working Memory 108 97 11 11.24 N   Immediate Memory 108 117 -9 10.35 N   Delayed Memory 107 128 -21 10.08 Y 4-5%  Statistical significance (critical value) at the .01 level.    To further analyze the patient's memory functioning we completed and abilities-memory analysis in which we utilize the general abilities index score from the Wechsler Adult Intelligence Scale-IV to make predictions of where she should fall on various memory indices.  These predictions were then compared to her actual performance on these memory indices for analysis.  The only area where the patient's performance fell short of predicted levels had to do with the patient's performance on her visual working memory which fell in the average range index score of 97.  Based on her general abilities index score we would have predicted a score of 108.  However, this 11 point difference is not statistically significant and when looking at very similar performance with regard to auditory working memory there are no indications of visual encoding or auditory encoding deficits.  All other measures of memory including the patient's immediate memory, auditory memory, visual memory, and delayed memory were all equal to or significantly greater than predicted levels.  In fact, her delayed memory index score was 21 points higher than her memory levels would have been predicted based on her general abilities index score.  Summary of Results:   Overall, the results of the current neuropsychological battery do not suggest any significant areas of neurocognitive/neuropsychological deficits.  While relative to overall an excellent performance with regard to verbal  skills including verbal reasoning abilities she did show some mild deficits relative to her own performance with regard to auditory and visual encoding abilities.  However, when these functions are compared to a normative comparison group they do fall in the average range with no location of significant deficits with regard to encoding abilities.  The patient also produced average performance with regard to her overall information processing speed and while this is below her excellent abilities with regard to verbal and visual reasoning abilities a do not indicate any significant deficits with regard to information processing speed or visual scanning/visual searching abilities.  Overall, the patient appears to be doing quite well from an objective neuropsychological standpoint with no significant indications of any areas of cognitive dysfunction or deficits.  Impression/Diagnosis:   The results  of the current neuropsychological evaluation are not consistent with any patterns typical of degenerative cortical dementia or consistent patterns of neuropsychological or neurocognitive deficits in general.  The patient produced average scores with regard to auditory and visual encoding abilities as well as her overall general processing speed.  All other measures assessed were in the high average to superior range of functioning.  Her excellent verbal reasoning abilities and verbal comprehension abilities are consistent with her educational and occupational history.  The patient does report that when she had stopped working on a regular basis and taken a leave of absence, that she had experienced a significant improvement in her functioning.  She also stated that there were significant differences between good days and bad days when she was having her difficulties and that sometimes she felt like she was doing quite well and up to baseline performance, while other times she had deficits to the point that she had asked for  assistance in driving directions from her husband or daughter or make significant mental errors.  As far as diagnostic consideration, it is possible that acute vascular changes of a migrainous type pattern could be causing exacerbation of her symptoms.  The area showing abnormalities in her MRI showing tiny right frontal subcortical white matter hyperintensities may suggest some vascular or other etiological factors.  Residual lesion/injury to these areas could explain some of her acute executive functioning issues and overall speed of mental operation issues but that would not explain the significant variability in symptoms from 1 day to another.  I do think that there is likely some acute issues going on that are vascular or possibly seizure in nature and the pattern of significant difference from 1 day to another would also point to acute vascular issues.  Clearly, the patient describes significant sleep disturbance and while the level of sleep disorder and sleep deprivation that she experiences good clearly cause cognitive difficulties the level of changes from 1 day to another would not be consistent with sleep deprivation as being the primary etiological feature in her memory and cognitive deficits.  However, it may be that her sleep disturbance and difficulties increase the probability of acute vascular changes related to migraine-like activities without the attack phase.  In any event, the pattern is not consistent with condition such as autoimmune disorders or degenerative dementia such as Alzheimer's or other degenerative cortical or subcortical dementias.  As the patient's experience with improvements once she stopped working highlight, it will be important for the patient to work on stress management issues and particularly important for her to work on sleep hygiene issues and potentially address possible migraine type events without attack phase from a pharmacological standpoint.  I will provide  feedback to the patient regarding the results of the current evaluation.  Please feel free to contact me if there are any additional questions that I have not addressed.  Diagnosis:    Axis I: Neurocognitive deficits   Arley Phenix, Psy.D. Neuropsychologist           WAIS-IV Wechsler Adult Intelligence Scale-Fourth Edition Score Report   Examinee Name Renee [name]  Date of Report 2018/10/25  Cristy Friedlander [id number]  Years of Education   Date of Birth 29-Jul-1955  Primary Language   Gender Female  Handedness   Race/Ethnicity   Examiner Name Arley Phenix  Date of Testing 2018/10/17  Age at Testing 63 years 2 months  Retest? No   Comments: Composite Score Summary  Scale Sum of Scaled Scores Composite  Score Percentile Rank 95% Conf. Interval Qualitative Description  Verbal Comprehension 43 VCI 125 95 118-130 Superior  Perceptual Reasoning 29 PRI 98 45 92-104 Average  Working Memory 19 WMI 97 42 90-104 Average  Processing Speed 26 PSI 117 87 107-124 High Average  Full Scale 117 FSIQ 111 77 107-115 High Average  General Ability 72 GAI 112 79 107-117 High Average  Confidence Intervals are based on the Overall Average SEMs. The [facility] is an optional composite summary score that is less sensitive to the influence of working memory and processing speed. Because working memory and processing speed are vital to a comprehensive evaluation of cognitive ability, it should be noted that the [facility] does not have the breadth of construct coverage as the FSIQ.   Note. The vertical bars represent the standard error of measurement (SEM). SEM values are based on the examinee's age.   ANALYSIS   Index Level Discrepancy Comparisons  Comparison Score 1 Score 2 Difference Critical Value .05 Significant Difference Y/N Base Rate by Overall Sample  VCI - PRI 125 98 27 8.31 Y 2.6  VCI - WMI 125 97 28 8.82 Y 1.8  VCI - PSI 125 117 8 10.19 N 31.5  PRI - WMI 98 97 1 9.74 N 49.0  PRI - PSI 98  117 -19 11.00 Y 10.4  WMI - PSI 97 117 -20 11.38 Y 8.8  FSIQ - GAI 111 112 -1 3.51 N 44.9  Base Rate by Overall Sample. Statistical significance (critical value) at the .05 level.  Verbal Comprehension Subtests Summary  Subtest Raw Score Scaled Score Percentile Rank Reference Group Scaled Score SEM  Similarities 32 14 91 14 1.08  Vocabulary 54 16 98 17 0.73  Information 20 13 84 14 0.67  (Comprehension) 32 15 95 15 1.08   Perceptual Reasoning Subtests Summary  Subtest Raw Score Scaled Score Percentile Rank Reference Group Scaled Score SEM  Block Design 41 11 63 9 1.04  Matrix Reasoning 14 9 37 7 0.95  Visual Puzzles 11 9 37 7 0.99  (Figure Weights) 15 12 75 10 0.99  (Picture Completion) 14 12 75 10 1.12   Working Librarian, academic Raw Score Scaled Score Percentile Rank Reference Group Scaled Score SEM  Digit Span 25 9 37 8 0.85  Arithmetic 14 10 50 10 1.04  (Letter-Number Seq.) 21 11 63 10 1.08   Processing Speed Subtests Summary  Subtest Raw Score Scaled Score Percentile Rank Reference Group Scaled Score SEM  Symbol Search 34 12 75 10 1.31  Coding 79 14 91 11 0.99  (Cancellation) 35 9 37 8 1.34   Subtest Level Discrepancy Comparisons  Subtest Comparison Score 1 Score 2 Difference Critical Value .05 Significant Difference Y/N Base Rate  Digit Span - Arithmetic 9 10 -1 2.57 N 42.20  Symbol Search - Coding 12 14 -2 3.41 N 25.70  Statistical significance (critical value) at the .05 level.       DETERMINING STRENGTHS AND WEAKNESSES   Differences Between Subtest and Overall Mean of Subtest Scores  Subtest Subtest Scaled Score Mean Scaled Score Difference Critical Value .05 Strength or Weakness Base Rate  Block Design 11 11.70 -0.70 2.85  >25%  Similarities 14 11.70 2.30 2.82  15-25%  Digit Span 9 11.70 -2.70 2.22 W 15-25%  Matrix Reasoning 9 11.70 -2.70 2.54 W 15-25%  Vocabulary 16 11.70 4.30 2.03 S 2-5%  Arithmetic 10 11.70 -1.70 2.73  >25%  Symbol  Search 12 11.70 0.30 3.42  >  25%  Visual Puzzles 9 11.70 -2.70 2.71  15-25%  Information 13 11.70 1.30 2.19  >25%  Coding 14 11.70 2.30 2.97  >25%  Overall: Mean = 11.70, Scatter =  7, Base rate =  48.9 Base Rate for Intersubtest Scatter is reported for 10 Subtests. Statistical significance (critical value) at the .05 level.   PROCESS ANALYSIS   Perceptual Reasoning Process Score Summary  Process Score Raw Score Scaled Score Percentile Rank SEM  Block Design No Time Bonus 40 12 75 1.12    Working Memory Process Score Summary  Process Score Raw Score Scaled Score Percentile Rank Base Rate SEM  Digit Span Forward -- 1.44  Digit Span Backward -- 1.27  Digit Span Sequencing 10 12 75 -- 1.37  Longest Digit Span Forward 5 -- -- 94.5 --  Longest Digit Span Backward 4 -- -- 79.5 --  Longest Digit Span Sequence 7 -- -- 15.0 --  Longest Letter-Number Sequence 6 -- -- 40.5 --    Process Level Discrepancy Comparisons  Process Comparison Score 1 Score 2 Difference Critical Value .05 Significant Difference Y/N Base Rate  Block Design - Block Design No Time Bonus 11 12 -1 3.08 N 24.0  Digit Span Forward - Digit Span Backward 8 8 0 3.65 N   Digit Span Forward - Digit Span Sequencing 8 12 -4 3.60 Y 13.0  Digit Span Backward - Digit Span Sequencing 8 12 -4 3.56 Y 10.8  Longest Digit Span Forward - Longest Digit Span Backward -- -- 83.5  Longest Digit Span Forward - Longest Digit Span Sequence 5 7 -2 -- -- 5.0  Longest Digit Span Backward - Longest Digit Span Sequence 4 7 -3 -- -- 14.5  Statistical significance (critical value) at the .05 level.   Raw Scores  Subtest Score Range Raw Score  Process Score Range Raw Score  Block Design 0-66 41  Block Design No Time Bonus 0-48 40  Similarities 0-36 32  Digit Span Forward 0-16 8  Digit Span 0-48 25  Digit Span Backward 0-16 7  Matrix Reasoning 0-26 14  Digit Span Sequencing 0-16 10  Vocabulary 0-57 54  Longest  Digit Span Forward 0, 2-9 5  Arithmetic 0-22 14  Longest Digit Span Backward 0, 2-8 4  Symbol Search 0-60 34  Longest Digit Span Sequence 0, 2-9 7  Visual Puzzles 0-26 11  Longest Letter-Number Seq. 0, 2-8 6  Information 0-26 20      Coding 0-135 79      Letter-Number Seq. 0-30 21      Figure Weights 0-27 15      Comprehension 0-36 32      Cancellation 0-72 35      Picture Completion 0-24 14          WMS-IV Wechsler Memory Scale-Fourth Edition Score Report   Examinee Name Renee [name]  Date of Report 2018/10/25  Jerrol Banana ID [id number]  Years of Education 19  Date of Birth April 03, 1956  Home Language   Gender Female  Handedness Not Specified  Race/Ethnicity   Examiner Name Arley Phenix  Date of Testing 2018/10/17  Age at Testing 63 years 2 months  Retest? No   Comments: Brief Cognitive Status Exam Classification  Age Years of Education Raw Score Classification Level Base Rate  63 years 2 months 18 53 Average 100.0    Index Score Summary  Index Sum of Scaled Scores Index Score Percentile Rank 95% Confidence  Interval Tour[facility] information centre manager Memory (AMI) 56 124 95 117-129 Superior  Visual Memory (VMI) 50 115 84 109-120 High Average  Visual Working Memory (VWMI) 19 97 42 90-104 Average  Immediate Memory (IMI) 50 117 87 110-122 High Average  Delayed Memory (DMI) 56 128 97 120-133 Superior    Index Score Profile  The vertical bars represent the standard error of measurement (SEM).   Primary Subtest Scaled Score Summary  Subtest Domain Raw Score Scaled Score Percentile Rank  Logical Memory I AM 34 13 84  Logical Memory II AM 34 15 95  Verbal Paired Associates I AM 41 13 84  Verbal Paired Associates II AM 14 15 95  Designs I VM 74 12 75  Designs II VM 64 13 84  Visual Reproduction I VM 38 12 75  Visual Reproduction II VM 33 13 84  Spatial Addition VWM 9 8 25   Symbol Span VWM 23 11 63   Primary Subtest Scaled Score Profile    PROCESS SCORE  CONVERSIONS  Auditory Memory Process Score Summary  Process Score Raw Score Scaled Score Percentile Rank Cumulative Percentage (Base Rate)  LM II Recognition 27 - - >75%  VPA II Recognition 40 - - >75%   Visual Memory Process Score Summary  Process Score Raw Score Scaled Score Percentile Rank Cumulative Percentage (Base Rate)  DE I Content 34 10 50 -  DE I Spatial 18 11 63 -  DE II Content 38 13 84 -  DE II Spatial 14 12 75 -  DE II Recognition 19 - - >75%  VR II Recognition 7 - - >75%    SUBTEST-LEVEL DIFFERENCES WITHIN INDEXES  Auditory Memory Index  Subtest Scaled Score AMI Mean Score Difference from Mean Critical Value Base Rate  Logical Memory I 13 14.00 -1.00 2.64 >25%  Logical Memory II 15 14.00 1.00 2.48 >25%  Verbal Paired Associates I 13 14.00 -1.00 1.90 >25%  Verbal Paired Associates II 15 14.00 1.00 2.48 >25%  Statistical significance (critical value) at the .05 level.   Visual Memory Index  Subtest Scaled Score VMI Mean Score Difference from Mean Critical Value Base Rate  Designs I 12 12.50 -0.50 2.38 >25%  Designs II 13 12.50 0.50 2.38 >25%  Visual Reproduction I 12 12.50 -0.50 1.86 >25%  Visual Reproduction II 13 12.50 0.50 1.48 >25%  Statistical significance (critical value) at the .05 level.   Immediate Memory Index  Subtest Scaled Score IMI Mean Score Difference from Mean Critical Value Base Rate  Logical Memory I 13 12.50 0.50 2.59 >25%  Verbal Paired Associates I 13 12.50 0.50 1.82 >25%  Designs I 12 12.50 -0.50 2.42 >25%  Visual Reproduction I 12 12.50 -0.50 1.91 >25%  Statistical significance (critical value) at the .05 level.   Delayed Memory Index  Subtest Scaled Score DMI Mean Score Difference from Mean Critical Value Base Rate  Logical Memory II 15 14.00 1.00 2.44 >25%  Verbal Paired Associates II 15 14.00 1.00 2.44 >25%  Designs II 13 14.00 -1.00 2.44 >25%  Visual Reproduction II 13 14.00 -1.00 1.57 >25%  Statistical significance  (critical value) at the .05 level.    SUBTEST DISCREPANCY COMPARISON  Comparison Score 1 Score 2 Difference Critical Value Base Rate  Spatial Addition - Symbol Span 8 11 -3 2.74 41.8  Statistical significance (critical value) at the .05 level.    SUBTEST-LEVEL CONTRAST SCALED SCORES  Logical Memory  Score Score 1 Score 2 Contrast Scaled Score  LM  II Recognition vs. Delayed Recall >75% 15 14  LM Immediate Recall vs. Delayed Recall Verbal Paired Associates  Score Score 1 Score 2 Contrast Scaled Score  VPA II Recognition vs. Delayed Recall >75% 15 16  VPA Immediate Recall vs. Delayed Recall Designs  Score Score 1 Score 2 Contrast Scaled Score  DE I Spatial vs. Content DE II Spatial vs. Content DE II Recognition vs. Delayed Recall >75% 13 11  DE Immediate Recall vs. Delayed Recall Visual Reproduction  Score Score 1 Score 2 Contrast Scaled Score  VR II Recognition vs. Delayed Recall >75% 13 12  VR Immediate Recall vs. Delayed Recall INDEX-LEVEL CONTRAST SCALED SCORES  WMS-IV Indexes  Score Score 1 Score 2 Contrast Scaled Score  Auditory Memory Index vs. Visual Memory Index 124 115 12  Visual Working Memory Index vs. Visual Memory Index 97 115 14  Immediate Memory Index vs. Delayed Memory Index 117 128 15    RAW SCORES  Subtest Score Range Adult Score Range Older Adult Raw Score  Brief Cognitive Status Exam 0-58 0-58 53  Logical Memory I 0-50 0-53 34  Logical Memory II 0-50 0-39 34  Verbal Paired Associates I 0-56 0-40 41  Verbal Paired Associates II 0-14 0-10 14  CVLT-II Trials 1-5 5-95 5-95   CVLT-II Long-Delay -5-5 -5-5   Designs I 0-120  74  Designs II 0-120  64  Visual Reproduction I 0-43 0-43 38  Visual Reproduction II 0-43 0-43 33  Spatial Addition 0-24  9  Symbol Span 0-50 0-50 23  Process Score Range Adult Score Range Older Adult Raw Score  LM II Recognition 0-30 0-23 27  VPA II  Recognition 0-40 0-30 40  VPA II Word Recall 0-28 0-20   DE I Content 0-48  34  DE I Spatial 0-24  18  DE II Content 0-48  38  DE II Spatial 0-24  14  DE II Recognition 0-24  19  VR II Recognition 0-7 0-7 7  VR II Copy 0-43 0-43     ABILITY-MEMORY ANALYSIS  Ability Score:  GAI: 112 Date of Testing:  WAIS-IV; WMS-IV 2018/10/17  Predicted Difference Method   Index Predicted WMS-IV Index Score Actual WMS-IV Index Score Difference Critical Value  Significant Difference Y/N Base Rate  Auditory Memory 106 124 -18 8.95 Y 5-10%  Visual Memory 107 115 -8 8.82 N   Visual Working Memory 108 97 11 11.24 N   Immediate Memory 108 117 -9 10.35 N   Delayed Memory 107 128 -21 10.08 Y 4-5%  Statistical significance (critical value) at the .01 level.   Contrast Scaled Scores  Score Score 1 Score 2 Contrast Scaled Score  General Ability Index vs. Auditory Memory Index 112 124 15  General Ability Index vs. Visual Memory Index 112 115 12  General Ability Index vs. Visual Working Memory Index 112 97 7  General Ability Index vs. Immediate Memory Index 112 117 13  General Ability Index vs. Delayed Memory Index 112 128 16  Verbal Comprehension Index vs. Auditory Memory Index 125 124 13  Perceptual Reasoning Index vs. Visual Memory Index 98 115 14  Perceptual Reasoning Index vs. Visual Working Memory Index 98 97 10  Working Memory Index vs. Auditory Memory Index 97 124 16  Working Memory Index vs. Visual Working Memory Index 97 97 10  End of Report

## 2018-10-25 NOTE — Progress Notes (Signed)
Neuropsychological Consultation   Patient:   Renee [name]   DOB:   [birth year]  MR Number:  [id number]  Location:  [facility] [address] [place], [phone] [facility] [place] [address] Dept: [phone]           Date of Service:   09/30/2018  Start Time:   10 AM End Time:   12 PM  Initial neurobehavioral status exam with face-to-face clinical interview with the patient for 1 hour with additional 1 hour records review and please report writing.  Provider/Observer:  Arley Phenix, Psy.D.       Clinical Neuropsychologist       Billing Code/Service: 62952, [phone]  Chief Complaint:    Renee [name] is a 63 year old female referred by Dr. [name], MD for neuropsychological evaluation.  The patient has been followed by her neurologist for issues related to memory loss and deficits in attentiveness.  The patient has had ongoing issues of memory loss, feeling disconnected, geographic disorientation in familiar places, confusion, expressive language deficits related to word retrieval.  The patient identified difficulty starting between 1-1/2 to 2 years ago and these changes were also noted by family and coworkers.  Reason for Service:  Renee [name] is a 63 year old female referred by Dr. [name], MD for neuropsychological evaluation.  The patient has been followed by her neurologist for issues related to memory loss and deficits in attentiveness.  The patient has had ongoing issues of memory loss, feeling disconnected, geographic disorientation in familiar places, confusion, expressive language deficits related to word retrieval.  The patient identified difficulty starting between 1-1/2 to 2 years ago and these changes were also noted by family and coworkers.  The patient had an MRI conducted on 08/17/2018 with findings showing brain parenchyma showing tiny right frontal subcortical and  periventricular white matter hyperintensities which are nonspecific.  Differential considerations suggested that the findings are seen in a variety of conditions including migraine headaches, small vessel disease, vasculitis, demyelinating, autoimmune or inflammatory conditions.  No other structural lesions, tumors or infarcts were noted.  The patient described her word finding issues related bloats giving the wrong words with different meetings but similar sounding" including paraphasic errors.  She described procedural errors such as putting detergent pods in the dryer getting carrots and carrot cake.  She reports that she will forget about meetings.  Geographic disorientation where she will get lost driving to very familiar places.  The patient reports that she has had to call her husband or daughter to get directions when she has gotten lost and disoriented and places that she should know what was very familiar to.  The patient describes being very easily distracted and forget things altogether.  The patient reports that she had severe migraines 35 years ago but she reports that she has not having them now.  The patient reports that she has a significant variation in her difficulties.  The patient reports that on some days she feels like she is "on her game" and other days she has very significant difficulties.  The patient reports that her symptoms were getting worse over time she got to the point where she did not trust herself to drive or cook.  However, she took a 81-month leave of absence 2 weeks of being out of work her symptoms improved to some degree.  The patient reports that she has had significant difficulties will have 1 night where she will  sleep well and then 2 days where she sleeps little to none and then will eventually crash and go to sleep.  The patient reports that she will be very sleepy during the early evening but cannot sleep for very long.  The patient reports that she wakes up  frequently during the night.  The patient reports that she has a good appetite.  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  The patient denied any significant family history of dementia.  She does have an older half sister that was diagnosed with Alzheimer's dementia a couple years ago when her sister was in her 34s.  The only surgical interventions that the patient has had through the years include a C-section in 1984, hip replacement surgery in 2017, and gastric bypass surgery for weight loss.  Current Status:  The patient describes memory changes, expressive language changes, geographic disorientation when driving, attentional difficulties, sleep disturbance, significant stress at work and worried about forgetting important things at work, and executive functioning difficulties.  Reliability of Information: The information is derived from 1 hour face-to-face clinical interview with the patient as well as review of available medical records including neurological notes and recent MRI.  Behavioral Observation: Renee [name]  presents as a 63 y.o.-year-old Right Caucasian Female who appeared her stated age. her dress was Appropriate and she was Well Groomed and her manners were Appropriate to the situation.  her participation was indicative of Appropriate and Redirectable behaviors.  There were not any physical disabilities noted.  she displayed an appropriate level of cooperation and motivation.     Interactions:    Active Appropriate and Redirectable  Attention:   abnormal and attention span appeared shorter than expected for age  Memory:   abnormal; remote memory intact, recent memory impaired  Visuo-spatial:  not examined  Speech (Volume):  normal  Speech:   normal; some word finding difficulties noted but not severe.  Thought Process:  Coherent  and Relevant  Though Content:  WNL; not suicidal and not homicidal  Orientation:   person, place, time/date and situation  Judgment:   Good  Planning:   Good  Affect:    Anxious  Mood:    Anxious  Insight:   Good  Intelligence:   very high  Marital Status/Living: The patient was born and raised in [place] with 1 sister and 3 half-sisters.  The patient currently lives with her husband and married for the past 13 years.  She had 1 previous marriage with 28 years duration.  The patient has a 50 year old daughter who has diabetes and migraines.  Current Employment: The patient works as an Research scientist (medical) and describes her job is very demanding and fast-paced.  Past Employment:  The patient worked as a Investment banker, corporate, Loss adjuster, chartered, Building services engineer, Event organiser, and Librarian, academic in the past.  Substance Use:  No concerns of substance abuse are reported.    Education:   The patient graduated from [employer] in [place] and got her undergraduate degree from [employer] and her masters in business administration from [employer].  Medical History:   Past Medical History:  Diagnosis Date  . Anemia    hx  . Arthritis   . Depression   . DM type 2 (diabetes mellitus, type 2) (HCC)   . Gastric bypass status for obesity    h/o  gastric bypass surgery followed by lap band  . Hypercholesterolemia   . Hypothyroidism   . Mood swings   . Osteopenia    on BMD in 07/2012, repeat in 2 yrs            Psychiatric History:  The patient does acknowledge dealing with depression in the past and current medications do include Paxil and Wellbutrin for her depression.  Family Med/Psych History:  Family History  Problem Relation Age of Onset  . Depression Mother   . Drug abuse Mother   . Early death Mother   . Heart attack Mother   . Hyperlipidemia Mother   . Hypertension Mother   . Mental illness Mother   . Heart attack Father   . Alcohol abuse  Father   . Arthritis Father   . Heart disease Father   . Hyperlipidemia Father   . Hypertension Father   . Alcohol abuse Sister   . Diabetes Sister   . Drug abuse Sister   . Hyperlipidemia Sister   . Hypertension Sister   . Arthritis Daughter   . Diabetes Daughter   . Hyperlipidemia Daughter   . Hypertension Daughter   . Miscarriages / [place] Daughter   . Arthritis Maternal Grandmother   . Hearing loss Maternal Grandmother   . Heart disease Maternal Grandmother   . Hypertension Maternal Grandmother   . Mental illness Sister     Risk of Suicide/Violence: virtually non-existent the patient denies any suicidal or homicidal ideation.  Impression/DX:  Jeannine BogaSherry Harned is a 63 year old female referred by Dr. Huston FoleySaima Athar, MD for neuropsychological evaluation.  The patient has been followed by her neurologist for issues related to memory loss and deficits in attentiveness.  The patient has had ongoing issues of memory loss, feeling disconnected, geographic disorientation in familiar places, confusion, expressive language deficits related to word retrieval.  The patient identified difficulty starting between 1-1/2 to 2 years ago and these changes were also noted by family and coworkers.  Disposition/Plan:  We have set the patient up for formal neuropsychological testing.  She will be administered the Wechsler Adult Intelligence Scale-IV as well as the Wechsler Memory Scale-IV and once these are completed a determination will be made as to any further testing that would be warranted and needed from a diagnostic and treatment recommendations standpoint.  Diagnosis:    Memory loss  Neurocognitive deficits         Electronically Signed   _______________________ Arley PhenixJohn , Psy.D.

## 2018-10-27 ENCOUNTER — Encounter (HOSPITAL_BASED_OUTPATIENT_CLINIC_OR_DEPARTMENT_OTHER): Payer: 59 | Admitting: Psychology

## 2018-10-27 ENCOUNTER — Encounter: Payer: Self-pay | Admitting: Psychology

## 2018-10-27 ENCOUNTER — Other Ambulatory Visit: Payer: Self-pay

## 2018-10-27 DIAGNOSIS — F331 Major depressive disorder, recurrent, moderate: Secondary | ICD-10-CM

## 2018-10-27 DIAGNOSIS — R413 Other amnesia: Secondary | ICD-10-CM | POA: Diagnosis not present

## 2018-10-27 DIAGNOSIS — R4189 Other symptoms and signs involving cognitive functions and awareness: Secondary | ICD-10-CM

## 2018-10-27 DIAGNOSIS — R29818 Other symptoms and signs involving the nervous system: Secondary | ICD-10-CM | POA: Diagnosis not present

## 2018-10-27 NOTE — Progress Notes (Signed)
Neuropsychological Evaluation    Patient:  Renee EchevariaSherry L [name]   DOB: 04/13/1956  MR Number: [id number]  [facility] [place] [facility] [address] [id number] [facility] [place] [id number] Dept: [phone]  Start: 2 PM End: 3 PM  The duration of this visit was 1 hour for neuropsychological interpretation and formal report writing.  Provider/Observer:     Hershal CoriaJohn R Deette Revak PsyD  Chief Complaint:      Chief Complaint  Patient presents with   Memory Loss   Other    Geographic disorientation, confusion, concentration issues, expressive language difficulties, feeling disconnected    Reason For Service:     Renee BogaSherry [name] is a 63 year old female referred by Dr. Huston FoleySaima Athar, MD for neuropsychological evaluation.  The patient has been followed by her neurologist for issues related to memory loss and deficits in attentiveness.  The patient has had ongoing issues of memory loss, feeling disconnected, geographic disorientation in familiar places, confusion, expressive language deficits related to word retrieval.  The patient identified difficulty starting between 1-1/2 to 2 years ago and these changes were also noted by family and coworkers.  The patient had an MRI conducted on 08/17/2018 with findings showing brain parenchyma showing tiny right frontal subcortical and periventricular white matter hyperintensities which are nonspecific.  Differential considerations suggested that the findings are seen in a variety of conditions including migraine headaches, small vessel disease, vasculitis, demyelinating, autoimmune or inflammatory conditions.  No other structural lesions, tumors or infarcts were noted.  The patient described her word finding issues related bloats giving the wrong words with different meetings but similar sounding" including paraphasic errors.  She described procedural errors such  as putting detergent pods in the dryer getting carrots and carrot cake.  She reports that she will forget about meetings.  Geographic disorientation where she will get lost driving to very familiar places.  The patient reports that she has had to call her husband or daughter to get directions when she has gotten lost and disoriented and places that she should know what was very familiar to.  The patient describes being very easily distracted and forget things altogether.  The patient reports that she had severe migraines 35 years ago but she reports that she has not having them now.  The patient reports that she has a significant variation in her difficulties.  The patient reports that on some days she feels like she is "on her game" and other days she has very significant difficulties.  The patient reports that her symptoms were getting worse over time she got to the point where she did not trust herself to drive or cook.  However, she took a 3431-month leave of absence 2 weeks of being out of work her symptoms improved to some degree.  The patient reports that she has had significant difficulties will have 1 night where she will sleep well and then 2 days where she sleeps little to none and then will eventually crash and go to sleep.  The patient reports that she will be very sleepy during the early evening but cannot sleep for very long.  The patient reports that she wakes up frequently during the night.  The patient reports that she has a good appetite.  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  The patient denied  any significant family history of dementia.  She does have an older half sister that was diagnosed with Alzheimer's dementia a couple years ago when her sister was in her 52s.  The only surgical interventions that the patient has had through the years  include a C-section in 1984, hip replacement surgery in 2017, and gastric bypass surgery for weight loss.  Testing Administered:  The patient was administered the Wechsler Adult Intelligence Scale-IV as well as the Wechsler Memory Scale-IV adult version.  Participation Level:   Active  Participation Quality:  Appropriate and Attentive      Behavioral Observation:  Well Groomed, Alert, and Appropriate.  The patient was administered this neuropsychological test battery by Thayer Headings, Psy.D. below are the mental status and behavioral observations noted during this test administration conducted on 10/17/2018.  Mental Status Examination & Behavior Observations: The patient arrived on time to her 13:00 testing appointment. She was administered the Wechsler Adult Intelligence Scale, 4th Edition (WAIS-IV), and Wechsler Memory Scale, 4th Edition (WMS-IV; Adult Battery). The evaluation lasted 180 minutes. She was alert and fully oriented. She was appropriately dressed and appeared to have good hygiene. She ambulated without difficulty. Her spontaneous speech was clear and prosodic with normal rate, tone, and volume. No word finding difficulties or language errors were noted. Mood was euthymic with appropriate and congruent affect. Thought processes were organized and logical with good abstract reasoning. Insight and Judgement are good.   She was cooperative with all assigned tasks and appeared to give good effort. She did not have any difficulty hearing or understanding test instructions and did not require any additional prompting. She exhibited good frustration tolerance on questions she did not know or tasks that were more difficult.    Test Results:   Initially, the validity of these measures appear to be appropriate and valid as the patient clearly appeared to try her hardest throughout the test administrations and gave good effort throughout.  This does appear to be a fair and valid sample of her  current neuropsychological/cognitive functioning.  Composite Score Summary  Scale Sum of Scaled Scores Composite Score Percentile Rank 95% Conf. Interval Qualitative Description  Verbal Comprehension 43 VCI 125 95 118-130 Superior  Perceptual Reasoning 29 PRI 98 45 92-104 Average  Working Memory 19 WMI 97 42 90-104 Average  Processing Speed 26 PSI 117 87 107-124 High Average  Full Scale 117 FSIQ 111 77 107-115 High Average  General Ability 72 GAI 112 79 107-117 High Average   The patient produced a full scale IQ score of 111 which falls at the 77th percentile and is in the high average range of functioning.  To adjust to issues related to encoding and information processing speed which sometimes can be affected acutely the general abilities index score is also produced.  The patient produced a general abilities index score of 112 which falls at the 79th percentile and is also in the high average range.  The patient had her best performing area with regard to her verbal comprehension index score which was 125 and fell at the 95th percentile and was in the superior range and was consistent with her overall educational history.  The patient produced a perceptual reasoning index score of 98 which falls at the 45th percentile and is in the average range in her lowest score had to do with her working memory which produced a working memory index score of 97 which fell at the 42nd percentile and was in the average range.  There  were no areas identified related to significant cognitive deficits although there was a 28 point difference between the patient's working memory and a 27 point difference between her perceptual rate memory versus her verbal comprehension abilities.   Verbal Comprehension Subtests Summary  Subtest Raw Score Scaled Score Percentile Rank Reference Group Scaled Score SEM  Similarities 32 14 91 14 1.08  Vocabulary 54 16 98 17 0.73  Information 20 13 84 14 0.67  (Comprehension) 32 15  95 15 1.08   The patient produced a verbal comprehension index score of 125 which falls at the 95th percentile and is in the superior range.  Individual subtest making up this measure were all in the upper end of the average to superior range of functioning.  The patient did very well with regard to verbal reasoning and problem-solving, her vocabulary knowledge and skill, the patient's general fund of information, as well as her social judgment and comprehension abilities.  There were no indications of any deficits in these verbal-based cognitive skill areas.  Perceptual Reasoning Subtests Summary  Subtest Raw Score Scaled Score Percentile Rank Reference Group Scaled Score SEM  Block Design 41 11 63 9 1.04  Matrix Reasoning 14 9 37 7 0.95  Visual Puzzles 11 9 37 7 0.99  (Figure Weights) 15 12 75 10 0.99  (Picture Completion) 14 12 75 10 1.12   The patient produced a perceptual reasoning index score of 98 which falls at the 45th percentile and is in the average range.  This is below her verbal comprehension index score but all of the subtest of this measure were in the average to upper end of the average range.  The patient did well on measures of visual-spatial/visual analysis, visual reasoning and problem-solving abilities as well as visual estimations and prediction and the ability to identify visual anomalies within a visual gestalt.  Her performances ranged from the 37th percentile to the 75th percentile respectively.  Working Librarian, academic Raw Score Scaled Score Percentile Rank Reference Group Scaled Score SEM  Digit Span 25 9 37 8 0.85  Arithmetic 14 10 50 10 1.04  (Letter-Number Seq.) 21 11 63 10 1.08   The patient produced a working memory index score of 97 which falls at the 42nd percentile and is in the average range.  Individual subtest making up this measure were all in the average range related to auditory encoding, auditory multi processing abilities and more  complex manipulation of immediate auditory information.  There were no indications of significant deficits in these areas.  Processing Speed Subtests Summary  Subtest Raw Score Scaled Score Percentile Rank Reference Group Scaled Score SEM  Symbol Search 34 12 75 10 1.31  Coding 79 14 91 11 0.99  (Cancellation) 35 9 37 8 1.34   The patient produced a processing speed index score 117 which falls at the 87th percentile and is in the high average range of functioning.  Individual subtest making up this measure fell in the average to high average range with the patient doing quite well on items related to overall information processing speed and visual scanning and visual searching abilities.  The patient did have some relative mild difficulties with distractibility from nontarget information visually and did have some degree of increased visual distractibility.   Index Score Summary  Index Sum of Scaled Scores Index Score Percentile Rank 95% Confidence Interval Qualitative Descriptor  Auditory Memory (AMI) 56 124 95 117-129 Superior  Visual Memory (VMI) 50 115 84  109-120 High Average  Visual Working Memory (VWMI) 19 97 42 90-104 Average  Immediate Memory (IMI) 50 117 87 110-122 High Average  Delayed Memory (DMI) 56 128 97 120-133 Superior    The patient was administered the Wechsler Memory Scale-IV.  The patient produced a working memory index score of 97 which falls at the 42nd percentile and is in the average range.  This performance is identical to her auditory working memory index score from the Wechsler Adult Intelligence Scale-IV.  This pattern shows that both auditory and visual encoding abilities and working memory are in the average range but they are slightly below those predicted based on other verbal and visual-based skills.  They clearly were no indications of significant or severe deficits for either auditory or visual and.  On the immediate memory index score the patient produced  an index score of 117 which falls at the 87th percentile and is in the high average range.  There were no indications of immediate learning deficits for either auditory or visual information.  When breaking up this initial learning the patient produced an auditory memory index score of 124 which falls at the 95th percentile and is in the superior range.  The patient produced a visual memory index score of 115 which is at the 84th percentile and is in the high average range.  These 2 scores are not statistically different from each other and suggest that both auditory and visual memory are doing quite well with visual memory slightly below auditory memory capacities.  The patient produced a delayed memory index score of 128 which falls at the 97th percentile and is in the superior range.  This includes both visual and auditory information with the recall of this information after a 20 to 30-minute delay.  The patient did exceptionally well retaining information that is been initially learned and retaining that information over period of delay.  There are no indications of any memory deficits and given her immediate memory and delayed memory scores she is retaining all learned information exceptionally well.   ABILITY-MEMORY ANALYSIS  Ability Score:  GAI: 112 Date of Testing:  WAIS-IV; WMS-IV 2018/10/17  Predicted Difference Method   Index Predicted WMS-IV Index Score Actual WMS-IV Index Score Difference Critical Value  Significant Difference Y/N Base Rate  Auditory Memory 106 124 -18 8.95 Y 5-10%  Visual Memory 107 115 -8 8.82 N   Visual Working Memory 108 97 11 11.24 N   Immediate Memory 108 117 -9 10.35 N   Delayed Memory 107 128 -21 10.08 Y 4-5%  Statistical significance (critical value) at the .01 level.   To further analyze the patient's memory performance we utilize the general abilities index score from the Wechsler Adult Intelligence Scale to create a prediction model for where the  patient's memory should be relative to overall cognitive functioning.  Typically there is a very strong correlation between overall intellectual and cognitive abilities and memory abilities.  With the exception of the patient's visual working memory all of her memory scales were equal to were significantly above predicted levels based on her general abilities index score.  She did exceptionally well relative to a predictive model on her auditory memory abilities and her delayed memory abilities.  The patient was 11 points below what would be predicted for her visual working memory which is also consistent with the mild relative deficits for auditory working memory identified on [employer] adult intelligence scale.  Summary of Results:   Overall, the results  of the current neuropsychological evaluation are consistent with very good cognitive functioning overall across a broad range of neurocognitive and neuropsychological measures.  The patient did exceptionally well with regard to her verbal-based skills including verbal knowledge and vocabulary as well as verbal reasoning and problem-solving and social judgment skills.  The patient did very well on measures of information processing speed as well.  The patient had some mild weaknesses relative to her own performance with regard to visual reasoning and problem-solving as well as measures of both auditory and visual encoding abilities.  However, all 3 of these areas were in the average range relative to her normative comparison group.  This level of performance is slightly below what would be predicted based on her occupational and educational history and also below what would be predicted by her global abilities index score.  Impression/Diagnosis:   Overall, the results of the current neuropsychological evaluation are not consistent with any patterns of degenerative cortical dementia such as Alzheimer's or other degenerative dementia conditions.  There were  no indications on her most recent MRI of acute effects or vascular issues related to widespread small vessel disease.  The patient is doing quite well on most cognitive areas of functioning with the exception of some mild deficits relative to her own performance and exceptional abilities with regard to auditory and verbal encoding abilities/attention and concentration as well as some mild weakness with overall information processing speed measures.  The patient also did better on the auditory memory versus visual memory.  Each of these elements point towards right hemisphere focal issues and right frontal lobe issues related to attention and concentration particularly for visual attention and concentration.  This pattern would be consistent with focal findings of her most recent MRI related to right frontal/subcortical and periventricular white matter hyperintensities.  As etiological factors it was hypothesized that these may be related to both prior and potentially acute prodromal aspects of migraine headaches but the patient denies any attack phase of migraine headaches currently.  Beyond this factor issues such as significant depressive episodes as well as hypervigilance over difficulties due to her history of taking care of her sister with Alzheimer's may be playing a role in her identifying some of the cognitive issues that she has become concerned about.  However, there are no objective findings of significant cognitive deficits identified on this neuropsychological assessment.  I will be providing feedback regarding the results of the current neuropsychological evaluation to the patient as well as making sure this report is available for the referring physician through epic electronic medical records.  Please feel free to contact me if there are any questions regarding the results of the current neuropsychological evaluation.  Diagnosis:    Axis I: Neurocognitive deficits  Moderate episode of  recurrent major depressive disorder (HCC)   Arley Phenix, Psy.D. Neuropsychologist          WAIS-IV Wechsler Adult Intelligence Scale-Fourth Edition Score Report   Examinee Name Renee [name]  Date of Report 2018/10/27  Cristy Friedlander [id number]  Years of Education   Date of Birth 01-18-1956  Primary Language   Gender Female  Handedness   Race/Ethnicity   Examiner Name Arley Phenix  Date of Testing 2018/10/17  Age at Testing 63 years 2 months  Retest? No   Comments: Composite Score Summary  Scale Sum of Scaled Scores Composite Score Percentile Rank 95% Conf. Interval Qualitative Description  Verbal Comprehension 43 VCI 125 95 118-130 Superior  Perceptual Reasoning 29 PRI 98 45  92-104 Average  Working Memory 19 WMI 97 42 90-104 Average  Processing Speed 26 PSI 117 87 107-124 High Average  Full Scale 117 FSIQ 111 77 107-115 High Average  General Ability 72 GAI 112 79 107-117 High Average  Confidence Intervals are based on the Overall Average SEMs. The [facility] is an optional composite summary score that is less sensitive to the influence of working memory and processing speed. Because working memory and processing speed are vital to a comprehensive evaluation of cognitive ability, it should be noted that the [facility] does not have the breadth of construct coverage as the FSIQ.   ANALYSIS   Index Level Discrepancy Comparisons  Comparison Score 1 Score 2 Difference Critical Value .05 Significant Difference Y/N Base Rate by Overall Sample  VCI - PRI 125 98 27 8.31 Y 2.6  VCI - WMI 125 97 28 8.82 Y 1.8  VCI - PSI 125 117 8 10.19 N 31.5  PRI - WMI 98 97 1 9.74 N 49.0  PRI - PSI 98 117 -19 11.00 Y 10.4  WMI - PSI 97 117 -20 11.38 Y 8.8  FSIQ - GAI 111 112 -1 3.51 N 44.9  Base Rate by Overall Sample. Statistical significance (critical value) at the .05 level.  Verbal Comprehension Subtests Summary  Subtest Raw Score Scaled Score Percentile Rank Reference Group Scaled Score  SEM  Similarities 32 14 91 14 1.08  Vocabulary 54 16 98 17 0.73  Information 20 13 84 14 0.67  (Comprehension) 32 15 95 15 1.08    Perceptual Reasoning Subtests Summary  Subtest Raw Score Scaled Score Percentile Rank Reference Group Scaled Score SEM  Block Design 41 11 63 9 1.04  Matrix Reasoning 14 9 37 7 0.95  Visual Puzzles 11 9 37 7 0.99  (Figure Weights) 15 12 75 10 0.99  (Picture Completion) 14 12 75 10 1.12   Working Librarian, academic Raw Score Scaled Score Percentile Rank Reference Group Scaled Score SEM  Digit Span 25 9 37 8 0.85  Arithmetic 14 10 50 10 1.04  (Letter-Number Seq.) 21 11 63 10 1.08   Processing Speed Subtests Summary  Subtest Raw Score Scaled Score Percentile Rank Reference Group Scaled Score SEM  Symbol Search 34 12 75 10 1.31  Coding 79 14 91 11 0.99  (Cancellation) 35 9 37 8 1.34   Subtest Level Discrepancy Comparisons  Subtest Comparison Score 1 Score 2 Difference Critical Value .05 Significant Difference Y/N Base Rate  Digit Span - Arithmetic 9 10 -1 2.57 N 42.20  Symbol Search - Coding 12 14 -2 3.41 N 25.70  Statistical significance (critical value) at the .05 level.    DETERMINING STRENGTHS AND WEAKNESSES   Differences Between Subtest and Overall Mean of Subtest Scores  Subtest Subtest Scaled Score Mean Scaled Score Difference Critical Value .05 Strength or Weakness Base Rate  Block Design 11 11.70 -0.70 2.85  >25%  Similarities 14 11.70 2.30 2.82  15-25%  Digit Span 9 11.70 -2.70 2.22 W 15-25%  Matrix Reasoning 9 11.70 -2.70 2.54 W 15-25%  Vocabulary 16 11.70 4.30 2.03 S 2-5%  Arithmetic 10 11.70 -1.70 2.73  >25%  Symbol Search 12 11.70 0.30 3.42  >25%  Visual Puzzles 9 11.70 -2.70 2.71  15-25%  Information 13 11.70 1.30 2.19  >25%  Coding 14 11.70 2.30 2.97  >25%  Overall: Mean = 11.70, Scatter =  7, Base rate =  48.9 Base Rate for Intersubtest Scatter is reported  for 10 Subtests. Statistical significance (critical  value) at the .05 level.   PROCESS ANALYSIS   Perceptual Reasoning Process Score Summary  Process Score Raw Score Scaled Score Percentile Rank SEM  Block Design No Time Bonus 40 12 75 1.12    Working Memory Process Score Summary  Process Score Raw Score Scaled Score Percentile Rank Base Rate SEM  Digit Span Forward 8 8 25  -- 1.44  Digit Span Backward 7 8 25  -- 1.27  Digit Span Sequencing 10 12 75 -- 1.37  Longest Digit Span Forward 5 -- -- 94.5 --  Longest Digit Span Backward 4 -- -- 79.5 --  Longest Digit Span Sequence 7 -- -- 15.0 --  Longest Letter-Number Sequence 6 -- -- 40.5 --    Process Level Discrepancy Comparisons  Process Comparison Score 1 Score 2 Difference Critical Value .05 Significant Difference Y/N Base Rate  Block Design - Block Design No Time Bonus 11 12 -1 3.08 N 24.0  Digit Span Forward - Digit Span Backward 8 8 0 3.65 N   Digit Span Forward - Digit Span Sequencing 8 12 -4 3.60 Y 13.0  Digit Span Backward - Digit Span Sequencing 8 12 -4 3.56 Y 10.8  Longest Digit Span Forward - Longest Digit Span Backward 5 4 1  -- -- 83.5  Longest Digit Span Forward - Longest Digit Span Sequence 5 7 -2 -- -- 5.0  Longest Digit Span Backward - Longest Digit Span Sequence 4 7 -3 -- -- 14.5  Statistical significance (critical value) at the .05 level.   Raw Scores  Subtest Score Range Raw Score  Process Score Range Raw Score  Block Design 0-66 41  Block Design No Time Bonus 0-48 40  Similarities 0-36 32  Digit Span Forward 0-16 8  Digit Span 0-48 25  Digit Span Backward 0-16 7  Matrix Reasoning 0-26 14  Digit Span Sequencing 0-16 10  Vocabulary 0-57 54  Longest Digit Span Forward 0, 2-9 5  Arithmetic 0-22 14  Longest Digit Span Backward 0, 2-8 4  Symbol Search 0-60 34  Longest Digit Span Sequence 0, 2-9 7  Visual Puzzles 0-26 11  Longest Letter-Number Seq. 0, 2-8 6  Information 0-26 20      Coding 0-135 79      Letter-Number Seq. 0-30 21      Figure  Weights 0-27 15      Comprehension 0-36 32      Cancellation 0-72 35      Picture Completion 0-24 14          WMS-IV Wechsler Memory Scale-Fourth Edition Score Report   Examinee Name Renee [name]  Date of Report 2018/10/27  Jerrol Banana ID [id number]  Years of Education 70  Date of Birth 1956/04/07  Home Language   Gender Female  Handedness Not Specified  Race/Ethnicity   Examiner Name Arley Phenix  Date of Testing 2018/10/17  Age at Testing 63 years 2 months  Retest? No   Comments: Brief Cognitive Status Exam Classification  Age Years of Education Raw Score Classification Level Base Rate  63 years 2 months 18 53 Average 100.0    Index Score Summary  Index Sum of Scaled Scores Index Score Percentile Rank 95% Confidence Interval Qualitative Descriptor  Auditory Memory (AMI) 56 124 95 117-129 Superior  Visual Memory (VMI) 50 115 84 109-120 High Average  Visual Working Memory (VWMI) 19 97 42 90-104 Average  Immediate Memory (IMI) 50 117 87 110-122 High Average  Delayed Memory (DMI)  56 128 97 120-133 Superior    Index Score Profile   Primary Subtest Scaled Score Summary  Subtest Domain Raw Score Scaled Score Percentile Rank  Logical Memory I AM 34 13 84  Logical Memory II AM 34 15 95  Verbal [facility] [facility] I AM 41 13 84  [facility] II AM 14 15 95  Designs I VM 74 12 75  Designs II VM 64 13 84  Visual Reproduction I VM 38 12 75  Visual Reproduction II VM 33 13 84  Spatial Addition VWM 9 8 25   Symbol Span VWM 23 11 63   Primary Subtest Scaled Score Profile    PROCESS SCORE CONVERSIONS  Auditory Memory Process Score Summary  Process Score Raw Score Scaled Score Percentile Rank Cumulative Percentage (Base Rate)  LM II Recognition 27 - - >75%  VPA II Recognition 40 - - >75%   Visual Memory Process Score Summary  Process Score Raw Score Scaled Score Percentile Rank Cumulative Percentage (Base Rate)  DE I Content 34 10 50 -  DE I Spatial 18  11 63 -  DE II Content 38 13 84 -  DE II Spatial 14 12 75 -  DE II Recognition 19 - - >75%  VR II Recognition 7 - - >75%    SUBTEST-LEVEL DIFFERENCES WITHIN INDEXES  Auditory Memory Index  Subtest Scaled Score AMI Mean Score Difference from Mean Critical Value Base Rate  Logical Memory I 13 14.00 -1.00 2.64 >25%  Logical Memory II 15 14.00 1.00 2.48 >25%  [facility]bal [facility] I 13 14.00 -1.00 1.90 >25%  Verbal [facility] II 15 14.00 1.00 2.48 >25%  Statistical significance (critical value) at the .05 level.   Visual Memory Index  Subtest Scaled Score VMI Mean Score Difference from Mean Critical Value Base Rate  Designs I 12 12.50 -0.50 2.38 >25%  Designs II 13 12.50 0.50 2.38 >25%  Visual Reproduction I 12 12.50 -0.50 1.86 >25%  Visual Reproduction II 13 12.50 0.50 1.48 >25%  Statistical significance (critical value) at the .05 level.   Immediate Memory Index  Subtest Scaled Score IMI Mean Score Difference from Mean Critical Value Base Rate  Logical Memory I 13 12.50 0.50 2.59 >25%  [facility] I 13 12.50 0.50 1.82 >25%  Designs I 12 12.50 -0.50 2.42 >25%  Visual Reproduction I 12 12.50 -0.50 1.91 >25%  Statistical significance (critical value) at the .05 level.   Delayed Memory Index  Subtest Scaled Score DMI Mean Score Difference from Mean Critical Value Base Rate  Logical Memory II 15 14.00 1.00 2.44 >25%  [facility] II 15 14.00 1.00 2.44 >25%  Designs II 13 14.00 -1.00 2.44 >25%  Visual Reproduction II 13 14.00 -1.00 1.57 >25%  Statistical significance (critical value) at the .05 level.    SUBTEST DISCREPANCY COMPARISON  Comparison Score 1 Score 2 Difference Critical Value Base Rate  Spatial Addition - Symbol Span 8 11 -3 2.74 41.8  Statistical significance (critical value) at the .05 level.    SUBTEST-LEVEL CONTRAST SCALED SCORES  Logical Memory  Score Score 1 Score 2 Contrast Scaled Score  LM II Recognition vs.  Delayed Recall >75% 15 14  LM Immediate Recall vs. Delayed Recall 13 15 14    Verbal [facility] [facility]  Score Score 1 Score 2 Contrast Scaled Score  VPA II Recognition vs. Delayed Recall >75% 15 16  VPA Immediate Recall vs. Delayed Recall 13 15 14    Designs  Score  Score 1 Score 2 Contrast Scaled Score  DE I Spatial vs. Content DE II Spatial vs. Content DE II Recognition vs. Delayed Recall >75% 13 11  DE Immediate Recall vs. Delayed Recall Visual Reproduction  Score Score 1 Score 2 Contrast Scaled Score  VR II Recognition vs. Delayed Recall >75% 13 12  VR Immediate Recall vs. Delayed Recall INDEX-LEVEL CONTRAST SCALED SCORES  WMS-IV Indexes  Score Score 1 Score 2 Contrast Scaled Score  Auditory Memory Index vs. Visual Memory Index 124 115 12  Visual Working Memory Index vs. Visual Memory Index 97 115 14  Immediate Memory Index vs. Delayed Memory Index 117 128 15    RAW SCORES  Subtest Score Range Adult Score Range Older Adult Raw Score  Brief Cognitive Status Exam 0-58 0-58 53  Logical Memory I 0-50 0-53 34  Logical Memory II 0-50 0-39 34  Verbal [facility] I 0-56 0-40 41  Verbal [facility] II 0-14 0-10 14  CVLT-II Trials 1-5 5-95 5-95   CVLT-II Long-Delay -5-5 -5-5   Designs I 0-120  74  Designs II 0-120  64  Visual Reproduction I 0-43 0-43 38  Visual Reproduction II 0-43 0-43 33  Spatial Addition 0-24  9  Symbol Span 0-50 0-50 23  Process Score Range Adult Score Range Older Adult Raw Score  LM II Recognition 0-30 0-23 27  VPA II Recognition 0-40 0-30 40  VPA II Word Recall 0-28 0-20   DE I Content 0-48  34  DE I Spatial 0-24  18  DE II Content 0-48  38  DE II Spatial 0-24  14  DE II Recognition 0-24  19  VR II Recognition 0-7 0-7 7  VR II Copy 0-43 0-43     ABILITY-MEMORY ANALYSIS  Ability Score:  GAI: 112 Date of Testing:  WAIS-IV; WMS-IV 2018/10/17  Predicted Difference Method   Index  Predicted WMS-IV Index Score Actual WMS-IV Index Score Difference Critical Value  Significant Difference Y/N Base Rate  Auditory Memory 106 124 -18 8.95 Y 5-10%  Visual Memory 107 115 -8 8.82 N   Visual Working Memory 108 97 11 11.24 N   Immediate Memory 108 117 -9 10.35 N   Delayed Memory 107 128 -21 10.08 Y 4-5%  Statistical significance (critical value) at the .01 level.   Contrast Scaled Scores  Score Score 1 Score 2 Contrast Scaled Score  General Ability Index vs. Auditory Memory Index 112 124 15  General Ability Index vs. Visual Memory Index 112 115 12  General Ability Index vs. Visual Working Memory Index 112 97 7  General Ability Index vs. Immediate Memory Index 112 117 13  General Ability Index vs. Delayed Memory Index 112 128 16  Verbal Comprehension Index vs. Auditory Memory Index 125 124 13  Perceptual Reasoning Index vs. Visual Memory Index 98 115 14  Perceptual Reasoning Index vs. Visual Working Memory Index 98 97 10  Working Memory Index vs. Auditory Memory Index 97 124 16  Working Memory Index vs. Visual Working Memory Index 97 97 10    End of Report

## 2018-10-28 ENCOUNTER — Ambulatory Visit: Payer: 59 | Admitting: Family Medicine

## 2018-10-31 ENCOUNTER — Encounter: Payer: Self-pay | Admitting: Family Medicine

## 2018-10-31 ENCOUNTER — Ambulatory Visit (INDEPENDENT_AMBULATORY_CARE_PROVIDER_SITE_OTHER): Payer: 59 | Admitting: Family Medicine

## 2018-10-31 VITALS — Ht 62.0 in

## 2018-10-31 DIAGNOSIS — E559 Vitamin D deficiency, unspecified: Secondary | ICD-10-CM | POA: Diagnosis not present

## 2018-10-31 DIAGNOSIS — R7309 Other abnormal glucose: Secondary | ICD-10-CM

## 2018-10-31 DIAGNOSIS — D508 Other iron deficiency anemias: Secondary | ICD-10-CM

## 2018-10-31 DIAGNOSIS — E538 Deficiency of other specified B group vitamins: Secondary | ICD-10-CM | POA: Diagnosis not present

## 2018-10-31 DIAGNOSIS — E039 Hypothyroidism, unspecified: Secondary | ICD-10-CM | POA: Diagnosis not present

## 2018-10-31 DIAGNOSIS — F332 Major depressive disorder, recurrent severe without psychotic features: Secondary | ICD-10-CM

## 2018-10-31 NOTE — Progress Notes (Addendum)
Virtual Visit via Video Note  I connected with [name] on 10/31/18 at  9:00 AM EDT by a video enabled telemedicine application and verified that I am speaking with the correct person using two identifiers.   I discussed the limitations of evaluation and management by telemedicine and the availability of in person appointments. The patient expressed understanding and agreed to proceed.  Interactive video and audio telecommunications were attempted between myself and the patient. However they failed due to the patient having technical difficulties or not having access to video capability. We continued and completed with audio only.  History of Present Illness:    Observations/Objective:   Assessment and Plan:   Follow Up Instructions:    I discussed the assessment and treatment plan with the patient. The patient was provided an opportunity to ask questions and all were answered. The patient agreed with the plan and demonstrated an understanding of the instructions.   The patient was advised to call back or seek an in-person evaluation if the symptoms worsen or if the condition fails to improve as anticipated.  I provided 20   Established Patient Office Visit  Subjective:  Patient ID: Renee [name], female    DOB: 1955-12-07  Age: 63 y.o. MRN: [id number]  CC:  Chief Complaint  Patient presents with  . Follow-up    HPI Renee [name] presents for presents for follow-up of her hypothyroidism.  She has been taking the 200 mcg and the 25 mcg pills on a fasting stomach every morning an hour before eating.  Continues to supplement with iron and B12.  She is taking her vitamin D as well.  Wellbutrin and Paxil have been working well for her depression.  Her mood has been definitely been elevated.  She took a 31-monthleave of absence from her job and actually was able to come back a month early because her work is considered to be central through the pandemic.  Past Medical  History:  Diagnosis Date  . Anemia    hx  . Arthritis   . Depression   . DM type 2 (diabetes mellitus, type 2) ([facility]   . Gastric bypass status for obesity    h/o gastric bypass surgery followed by lap band  . Hypercholesterolemia   . Hypothyroidism   . Mood swings   . Osteopenia    on BMD in 07/2012, repeat in 2 yrs    Past Surgical History:  Procedure Laterality Date  . bunions Bilateral 08  . CESAREAN SECTION     31 yrs ago  . CHOLECYSTECTOMY    . COLONOSCOPY    . GASTRIC BYPASS  02  . HAMMER TOE SURGERY Bilateral 08  . HIP FRACTURE SURGERY Right 29 yrs ago   screws  . KNEE ARTHROSCOPY WITH ANTERIOR CRUCIATE LIGAMENT (ACL) REPAIR Right    28 yrs ago  . LAPAROSCOPIC GASTRIC BANDING  11  . 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 SHOULDER ARTHROSCOPY WITH SUBACROMIAL DECOMPRESSION AND OPEN ROTATOR C Right    28 yrs ago  . TOTAL HIP ARTHROPLASTY Right 10/04/2015   Procedure: TOTAL RIGHT HIP ARTHROPLASTY;  Surgeon: DEarlie Server MD;  [facility] [facility]  Service: Orthopedics;  Laterality: Right;    Family History  Problem Relation Age of Onset  . Depression Mother   . Drug abuse Mother   . Early death Mother   . Heart attack Mother   . Hyperlipidemia Mother   . Hypertension Mother   .  Mental illness Mother   . Heart attack Father   . Alcohol abuse Father   . Arthritis Father   . Heart disease Father   . Hyperlipidemia Father   . Hypertension Father   . Alcohol abuse Sister   . Diabetes Sister   . Drug abuse Sister   . Hyperlipidemia Sister   . Hypertension Sister   . Arthritis Daughter   . Diabetes Daughter   . Hyperlipidemia Daughter   . Hypertension Daughter   . Miscarriages / [place] Daughter   . Arthritis Maternal Grandmother   . Hearing loss Maternal Grandmother   . Heart disease Maternal Grandmother   . Hypertension Maternal Grandmother   . Mental illness Sister     Social History   Socioeconomic History  . Marital status: Married    Spouse  name: Not on file  . Number of children: Not on file  . Years of education: Not on file  . Highest education level: Not on file  Occupational History  . Not on file  Social Needs  . Financial resource strain: Not on file  . Food insecurity:    Worry: Not on file    Inability: Not on file  . Transportation needs:    Medical: Not on file    Non-medical: Not on file  Tobacco Use  . Smoking status: Never Smoker  . Smokeless tobacco: Never Used  Substance and Sexual Activity  . Alcohol use: No  . Drug use: No  . Sexual activity: Yes    Comment: 1st intercourse 63 yo-Fewer than 5 partners  Lifestyle  . Physical activity:    Days per week: Not on file    Minutes per session: Not on file  . Stress: Not on file  Relationships  . Social connections:    Talks on phone: Not on file    Gets together: Not on file    Attends religious service: Not on file    Active member of club or organization: Not on file    Attends meetings of clubs or organizations: Not on file    Relationship status: Not on file  . Intimate partner violence:    Fear of current or ex partner: Not on file    Emotionally abused: Not on file    Physically abused: Not on file    Forced sexual activity: Not on file  Other Topics Concern  . Not on file  Social History Narrative  . Not on file    Outpatient Medications Prior to Visit  Medication Sig Dispense Refill  . Apoaequorin (PREVAGEN PO) Take by mouth.    . B Complex Vitamins (B COMPLEX-B12) TABS Take one packet daily 90 each 1  . buPROPion (WELLBUTRIN XL) 150 MG 24 hr tablet TAKE 1 TABLET BY MOUTH DAILY 30 tablet 3  . cholecalciferol (VITAMIN D3) 25 MCG (1000 UT) tablet Take 1,000 Units by mouth daily.    Marland Kitchen estrogen, conjugated,-medroxyprogesterone (PREMPRO) 0.45-1.5 MG tablet Take 1 tablet by mouth daily. 90 tablet 3  . levothyroxine (SYNTHROID, LEVOTHROID) 200 MCG tablet Take 200 mcg by mouth daily before breakfast.    . PARoxetine (PAXIL) 20 MG tablet  Take 2 tablets (40 mg total) by mouth daily. 60 tablet 3  . traMADol (ULTRAM) 50 MG tablet Take 1 tablet (50 mg total) by mouth every 8 (eight) hours as needed. 30 tablet 0  . Vitamin D, Ergocalciferol, (DRISDOL) 1.25 MG (50000 UT) CAPS capsule Take 1 capsule (50,000 Units total) by mouth every  7 (seven) days. 30 capsule 2  . levothyroxine (SYNTHROID, LEVOTHROID) 25 MCG tablet Take 1 tablet (25 mcg total) by mouth daily before breakfast. To be taken in addition of her 288mg. PLEASE DO NOT CHANGE MANUFACTURERS. 90 tablet 0   No facility-administered medications prior to visit.     Allergies  Allergen Reactions  . Erythromycin Palpitations    ROS Review of Systems  Constitutional: Negative.   Respiratory: Negative.   Cardiovascular: Negative.   Gastrointestinal: Negative.       Objective:    Physical Exam  Constitutional: She is oriented to person, place, and time. No distress.  Pulmonary/Chest: Effort normal.  Neurological: She is alert and oriented to person, place, and time.  Psychiatric: She has a normal mood and affect. Her behavior is normal.    Ht _0  (1.575 m)   BMI 27.25 kg/m  Wt Readings from Last 3 Encounters:  08/26/18 149 lb (67.6 kg)  08/03/18 163 lb (73.9 kg)  07/04/18 164 lb (74.4 kg)     Health Maintenance Due  Topic Date Due  . COLONOSCOPY  07/23/2005    There are no preventive care reminders to display for this patient.  Lab Results  Component Value Date   TSH 4.94 (H) 11/02/2018   Lab Results  Component Value Date   WBC 7.2 11/02/2018   HGB 12.7 11/02/2018   HCT 38.3 11/02/2018   MCV 93.5 11/02/2018   PLT 303.0 11/02/2018   Lab Results  Component Value Date   NA 139 11/02/2018   K 4.1 11/02/2018   CHLORIDE 110 (H) 01/09/2016   CO2 24 11/02/2018   GLUCOSE 89 11/02/2018   BUN 16 11/02/2018   CREATININE 0.69 11/02/2018   BILITOT 0.4 05/30/2018   ALKPHOS 75 05/30/2018   AST 16 05/30/2018   ALT 13 05/30/2018   PROT 6.3  05/30/2018   ALBUMIN 3.9 05/30/2018   CALCIUM 8.6 11/02/2018   ANIONGAP 9 01/09/2016   EGFR 76 (L) 01/09/2016   GFR 85.85 11/02/2018   Lab Results  Component Value Date   CHOL 154 05/30/2018   Lab Results  Component Value Date   HDL 66.80 05/30/2018   Lab Results  Component Value Date   LDLCALC 66 05/30/2018   Lab Results  Component Value Date   TRIG 109.0 05/30/2018   Lab Results  Component Value Date   CHOLHDL 2 05/30/2018   Lab Results  Component Value Date   HGBA1C 5.0 08/26/2018      Assessment & Plan:   Problem List Items Addressed This Visit      Endocrine   Hypothyroidism - Primary   Relevant Medications   levothyroxine (SYNTHROID, LEVOTHROID) 50 MCG tablet   Other Relevant Orders   TSH (Completed)     Other   Iron deficiency anemia   Relevant Orders   CBC (Completed)   Iron, TIBC and Ferritin Panel (Completed)   B12 deficiency   Relevant Orders   Vitamin B12 (Completed)   Vitamin D deficiency   Relevant Orders   VITAMIN D 25 Hydroxy (Vit-D Deficiency, Fractures) (Completed)   Severe episode of recurrent major depressive disorder, without psychotic features (HCC)   Elevated glucose   Relevant Orders   Basic metabolic panel (Completed)      Meds ordered this encounter  Medications  . levothyroxine (SYNTHROID, LEVOTHROID) 50 MCG tablet    Sig: Take 1 tablet (50 mcg total) by mouth daily.    Dispense:  90 tablet    Refill:  3    Follow-up: Return in about 2 months (around 12/31/2018).    Libby Maw, MD minutes of non-face-to-face time during this encounter.

## 2018-11-01 ENCOUNTER — Encounter: Payer: Self-pay | Admitting: Family Medicine

## 2018-11-01 ENCOUNTER — Telehealth: Payer: Self-pay

## 2018-11-01 NOTE — Telephone Encounter (Signed)
Questions for Screening COVID-19  During this illness, did/does the patient experience any of the following symptoms? Fever >100.34F []   Yes [x]   No []   Unknown Subjective fever (felt feverish) []   Yes [x]   No []   Unknown Chills []   Yes [x]   No []   Unknown Muscle aches (myalgia) []   Yes [x]   No []   Unknown Runny nose (rhinorrhea) []   Yes [x]   No []   Unknown Sore throat []   Yes [x]   No []   Unknown Cough (new onset or worsening of chronic cough) []   Yes [x]   No []   Unknown Shortness of breath (dyspnea) []   Yes [x]   No []   Unknown Nausea or vomiting []   Yes [x]   No []   Unknown Headache []   Yes [x]   No []   Unknown Abdominal pain  []   Yes [x]   No []   Unknown Diarrhea (?3 loose/looser than normal stools/24hr period) []   Yes [x]   No []   Unknown Other, specify:  Patient risk factors: Smoker? []   Current []   Former []   Never If female, currently pregnant? []   Yes []   No  Patient Active Problem List   Diagnosis Date Noted  . Need for diphtheria-tetanus-pertussis (Tdap) vaccine 08/26/2018  . Need for influenza vaccination 08/26/2018  . Elevated glucose 08/26/2018  . Screen for colon cancer 05/30/2018  . Muscle cramp 05/30/2018  . Severe episode of recurrent major depressive disorder, without psychotic features (HCC) 05/30/2018  . Memory disorder 05/30/2018  . Hypothyroidism 05/30/2018  . Post-traumatic osteoarthritis of right hip 10/04/2015  . Iron deficiency anemia 06/27/2015  . B12 deficiency 06/27/2015  . Vitamin D deficiency 06/27/2015  . Complications of gastric bypass surgery 06/27/2015  . Intestinal malabsorption following gastrectomy 06/27/2015    Plan:  []   High risk for COVID-19 with red flags go to ED (with CP, SOB, weak/lightheaded, or fever > 101.5). Call ahead.  []   High risk for COVID-19 but stable. Inform provider and coordinate time for [facility] visit.   []   No red flags but URI signs or symptoms okay for [facility] visit.

## 2018-11-02 ENCOUNTER — Other Ambulatory Visit (INDEPENDENT_AMBULATORY_CARE_PROVIDER_SITE_OTHER): Payer: 59

## 2018-11-02 DIAGNOSIS — R7309 Other abnormal glucose: Secondary | ICD-10-CM

## 2018-11-02 DIAGNOSIS — E538 Deficiency of other specified B group vitamins: Secondary | ICD-10-CM

## 2018-11-02 DIAGNOSIS — E559 Vitamin D deficiency, unspecified: Secondary | ICD-10-CM | POA: Diagnosis not present

## 2018-11-02 DIAGNOSIS — E039 Hypothyroidism, unspecified: Secondary | ICD-10-CM | POA: Diagnosis not present

## 2018-11-02 DIAGNOSIS — D508 Other iron deficiency anemias: Secondary | ICD-10-CM

## 2018-11-02 LAB — BASIC METABOLIC PANEL
BUN: 16 mg/dL (ref 6–23)
CO2: 24 mEq/L (ref 19–32)
Calcium: 8.6 mg/dL (ref 8.4–10.5)
Chloride: 107 mEq/L (ref 96–112)
Creatinine, Ser: 0.69 mg/dL (ref 0.40–1.20)
GFR: 85.85 mL/min (ref >60.00)
Glucose, Bld: 89 mg/dL (ref 70–99)
Potassium: 4.1 mEq/L (ref 3.5–5.1)
Sodium: 139 mEq/L (ref 135–145)

## 2018-11-02 LAB — CBC
HCT: 38.3 % (ref 36.0–46.0)
Hemoglobin: 12.7 g/dL (ref 12.0–15.0)
MCHC: 33 g/dL (ref 30.0–36.0)
MCV: 93.5 fl (ref 78.0–100.0)
Platelets: 303 10*3/uL (ref 150.0–400.0)
RBC: 4.1 Mil/uL (ref 3.87–5.11)
RDW: 14.5 % (ref 11.5–15.5)
WBC: 7.2 10*3/uL (ref 4.0–10.5)

## 2018-11-02 LAB — VITAMIN B12: Vitamin B-12: >1500 pg/mL — ABNORMAL HIGH (ref 211–911)

## 2018-11-02 LAB — TSH: TSH: 4.94 u[IU]/mL — ABNORMAL HIGH (ref 0.35–4.50)

## 2018-11-02 LAB — VITAMIN D 25 HYDROXY (VIT D DEFICIENCY, FRACTURES): VITD: 20.37 ng/mL — ABNORMAL LOW (ref 30.00–100.00)

## 2018-11-03 LAB — IRON,TIBC AND FERRITIN PANEL
%SAT: 22 % (calc) (ref 16–45)
Ferritin: 17 ng/mL (ref 16–288)
Iron: 88 ug/dL (ref 45–160)
TIBC: 404 mcg/dL (calc) (ref 250–450)

## 2018-11-03 MED ORDER — LEVOTHYROXINE SODIUM 50 MCG PO TABS: 50.0000 ug | ORAL_TABLET | Freq: Every day | ORAL | 3 refills | Status: DC

## 2018-11-03 NOTE — Addendum Note (Signed)
Addended by: Andrez Grime on: 11/03/2018 11:53 AM   Modules accepted: Orders

## 2018-11-04 ENCOUNTER — Ambulatory Visit: Payer: 59 | Admitting: Psychology

## 2018-12-06 ENCOUNTER — Ambulatory Visit: Payer: 59 | Admitting: Psychology

## 2018-12-26 ENCOUNTER — Telehealth: Payer: Self-pay | Admitting: Family Medicine

## 2018-12-26 NOTE — Telephone Encounter (Signed)
Okay to schedule [id number]

## 2018-12-26 NOTE — Telephone Encounter (Signed)
I called pt about upcoming appt to switch to virtual visit but she said he doesn't like doing the virtual visit and would just rather wait until she can be seen in office, she is fine with not having this appt for now as long as it doesn't hold up her medications, if she has to be seen or have labs for them to be refilled she is fine with that and just told us to contact her back to let her know.

## 2018-12-26 NOTE — Telephone Encounter (Signed)
I should have plenty of appointments to see her face to face.

## 2018-12-27 NOTE — Telephone Encounter (Signed)
Done

## 2019-01-05 ENCOUNTER — Ambulatory Visit: Payer: 59 | Admitting: Family Medicine

## 2019-01-24 ENCOUNTER — Encounter: Payer: Self-pay | Admitting: Family Medicine

## 2019-01-24 DIAGNOSIS — E039 Hypothyroidism, unspecified: Secondary | ICD-10-CM

## 2019-01-24 MED ORDER — LEVOTHYROXINE SODIUM 200 MCG PO TABS: 200.0000 ug | ORAL_TABLET | Freq: Every day | ORAL | 1 refills | Status: DC

## 2019-01-24 MED ORDER — LEVOTHYROXINE SODIUM 50 MCG PO TABS: 50.0000 ug | ORAL_TABLET | Freq: Every day | ORAL | 1 refills | Status: DC

## 2019-01-26 ENCOUNTER — Ambulatory Visit: Payer: 59 | Admitting: Family Medicine

## 2019-01-26 ENCOUNTER — Other Ambulatory Visit: Payer: Self-pay

## 2019-01-26 ENCOUNTER — Encounter: Payer: Self-pay | Admitting: Family Medicine

## 2019-01-26 VITALS — BP 124/70 | HR 95 | Ht 62.0 in | Wt 167.5 lb

## 2019-01-26 DIAGNOSIS — D508 Other iron deficiency anemias: Secondary | ICD-10-CM | POA: Diagnosis not present

## 2019-01-26 DIAGNOSIS — E538 Deficiency of other specified B group vitamins: Secondary | ICD-10-CM

## 2019-01-26 DIAGNOSIS — E039 Hypothyroidism, unspecified: Secondary | ICD-10-CM

## 2019-01-26 DIAGNOSIS — F332 Major depressive disorder, recurrent severe without psychotic features: Secondary | ICD-10-CM

## 2019-01-26 DIAGNOSIS — R7309 Other abnormal glucose: Secondary | ICD-10-CM

## 2019-01-26 DIAGNOSIS — E559 Vitamin D deficiency, unspecified: Secondary | ICD-10-CM | POA: Diagnosis not present

## 2019-01-26 LAB — BASIC METABOLIC PANEL
BUN: 15 mg/dL (ref 6–23)
CO2: 25 mEq/L (ref 19–32)
Calcium: 8.7 mg/dL (ref 8.4–10.5)
Chloride: 104 mEq/L (ref 96–112)
Creatinine, Ser: 0.62 mg/dL (ref 0.40–1.20)
GFR: 97.06 mL/min (ref >60.00)
Glucose, Bld: 87 mg/dL (ref 70–99)
Potassium: 4.6 mEq/L (ref 3.5–5.1)
Sodium: 139 mEq/L (ref 135–145)

## 2019-01-26 LAB — VITAMIN B12: Vitamin B-12: 938 pg/mL — ABNORMAL HIGH (ref 211–911)

## 2019-01-26 LAB — VITAMIN D 25 HYDROXY (VIT D DEFICIENCY, FRACTURES): VITD: 14.18 ng/mL — ABNORMAL LOW (ref 30.00–100.00)

## 2019-01-26 LAB — CBC
HCT: 37.7 % (ref 36.0–46.0)
Hemoglobin: 12.3 g/dL (ref 12.0–15.0)
MCHC: 32.6 g/dL (ref 30.0–36.0)
MCV: 95.5 fl (ref 78.0–100.0)
Platelets: 315 10*3/uL (ref 150.0–400.0)
RBC: 3.95 Mil/uL (ref 3.87–5.11)
RDW: 14.4 % (ref 11.5–15.5)
WBC: 10.5 10*3/uL (ref 4.0–10.5)

## 2019-01-26 LAB — TSH: TSH: 0.54 u[IU]/mL (ref 0.35–4.50)

## 2019-01-26 NOTE — Progress Notes (Addendum)
Established Patient Office Visit  Subjective:  Patient ID: Renee [name], female    DOB: 05/30/1956  Age: 63 y.o. MRN: [id number]  CC:  Chief Complaint  Patient presents with  . Follow-up    HPI Renee [name] presents for follow-up of her depression.  Doing quite well with the Paxil and Wellbutrin.  Would like to continue it.  Continues to take B complex along with a multivitamin with iron in it.  She is taking her high-dose vitamin D weekly.  She is taking both of her thyroid pills on a fasting stomach before eating in the morning.  She plans to retire in September.  She will stay home and take care of the home the yard and all the maintenance issues she was spent time reading and exercising.  Past Medical History:  Diagnosis Date  . Anemia    hx  . Arthritis   . Depression   . DM type 2 (diabetes mellitus, type 2) ([place])   . Gastric bypass status for obesity    h/o gastric bypass surgery followed by lap band  . Hypercholesterolemia   . Hypothyroidism   . Mood swings   . Osteopenia    on BMD in 07/2012, repeat in 2 yrs    Past Surgical History:  Procedure Laterality Date  . bunions Bilateral 08  . CESAREAN SECTION     31 yrs ago  . CHOLECYSTECTOMY    . COLONOSCOPY    . GASTRIC BYPASS  02  . HAMMER TOE SURGERY Bilateral 08  . HIP FRACTURE SURGERY Right 29 yrs ago   screws  . KNEE ARTHROSCOPY WITH ANTERIOR CRUCIATE LIGAMENT (ACL) REPAIR Right    28 yrs ago  . LAPAROSCOPIC GASTRIC BANDING  11  . 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 SHOULDER ARTHROSCOPY WITH SUBACROMIAL DECOMPRESSION AND OPEN ROTATOR C Right    28 yrs ago  . TOTAL HIP ARTHROPLASTY Right 10/04/2015   Procedure: TOTAL RIGHT HIP ARTHROPLASTY;  Surgeon: Earlie Server, MD;  Location: [facility];  Service: Orthopedics;  Laterality: Right;    Family History  Problem Relation Age of Onset  . Depression Mother   . Drug abuse Mother   . Early death Mother   . Heart attack Mother   . Hyperlipidemia Mother    . Hypertension Mother   . Mental illness Mother   . Heart attack Father   . Alcohol abuse Father   . Arthritis Father   . Heart disease Father   . Hyperlipidemia Father   . Hypertension Father   . Alcohol abuse Sister   . Diabetes Sister   . Drug abuse Sister   . Hyperlipidemia Sister   . Hypertension Sister   . Arthritis Daughter   . Diabetes Daughter   . Hyperlipidemia Daughter   . Hypertension Daughter   . Miscarriages / [place] Daughter   . Arthritis Maternal Grandmother   . Hearing loss Maternal Grandmother   . Heart disease Maternal Grandmother   . Hypertension Maternal Grandmother   . Mental illness Sister     Social History   Socioeconomic History  . Marital status: Married    Spouse name: Not on file  . Number of children: Not on file  . Years of education: Not on file  . Highest education level: Not on file  Occupational History  . Not on file  Social Needs  . Financial resource strain: Not on file  . Food insecurity    Worry:  Not on file    Inability: Not on file  . Transportation needs    Medical: Not on file    Non-medical: Not on file  Tobacco Use  . Smoking status: Never Smoker  . Smokeless tobacco: Never Used  Substance and Sexual Activity  . Alcohol use: No  . Drug use: No  . Sexual activity: Yes    Comment: 1st intercourse 63 yo-Fewer than 5 partners  Lifestyle  . Physical activity    Days per week: Not on file    Minutes per session: Not on file  . Stress: Not on file  Relationships  . Social Herbalist on phone: Not on file    Gets together: Not on file    Attends religious service: Not on file    Active member of club or organization: Not on file    Attends meetings of clubs or organizations: Not on file    Relationship status: Not on file  . Intimate partner violence    Fear of current or ex partner: Not on file    Emotionally abused: Not on file    Physically abused: Not on file    Forced sexual activity: Not on  file  Other Topics Concern  . Not on file  Social History Narrative  . Not on file    Outpatient Medications Prior to Visit  Medication Sig Dispense Refill  . Apoaequorin (PREVAGEN PO) Take by mouth.    . B Complex Vitamins (B COMPLEX-B12) TABS Take one packet daily 90 each 1  . buPROPion (WELLBUTRIN XL) 150 MG 24 hr tablet TAKE 1 TABLET BY MOUTH DAILY 30 tablet 3  . cholecalciferol (VITAMIN D3) 25 MCG (1000 UT) tablet Take 1,000 Units by mouth daily.    Marland Kitchen estrogen, conjugated,-medroxyprogesterone (PREMPRO) 0.45-1.5 MG tablet Take 1 tablet by mouth daily. 90 tablet 3  . Multiple Vitamins-Iron (MULTIVITAMIN/IRON PO) Take 1 tablet by mouth daily.    Marland Kitchen PARoxetine (PAXIL) 20 MG tablet Take 2 tablets (40 mg total) by mouth daily. 60 tablet 3  . traMADol (ULTRAM) 50 MG tablet Take 1 tablet (50 mg total) by mouth every 8 (eight) hours as needed. 30 tablet 0  . levothyroxine (SYNTHROID) 200 MCG tablet Take 1 tablet (200 mcg total) by mouth daily before breakfast. 90 tablet 1  . levothyroxine (SYNTHROID) 50 MCG tablet Take 1 tablet (50 mcg total) by mouth daily. 90 tablet 1  . Vitamin D, Ergocalciferol, (DRISDOL) 1.25 MG (50000 UT) CAPS capsule Take 1 capsule (50,000 Units total) by mouth every 7 (seven) days. 30 capsule 2   No facility-administered medications prior to visit.     Allergies  Allergen Reactions  . Erythromycin Palpitations    ROS Review of Systems  Constitutional: Negative.   HENT: Negative.   Eyes: Negative for photophobia and visual disturbance.  Respiratory: Negative.   Cardiovascular: Negative.   Gastrointestinal: Negative.   Endocrine: Negative for polyphagia.  Genitourinary: Negative.   Musculoskeletal: Negative for gait problem and joint swelling.  Hematological: Does not bruise/bleed easily.  Psychiatric/Behavioral: Negative.       Objective:    Physical Exam  Constitutional: She is oriented to person, place, and time. She appears well-developed and  well-nourished. No distress.  HENT:  Head: Normocephalic and atraumatic.  Right Ear: External ear normal.  Left Ear: External ear normal.  Mouth/Throat: Oropharynx is clear and moist.  Eyes: Conjunctivae are normal. Right eye exhibits no discharge. Left eye exhibits no discharge. No scleral  icterus.  Neck: No JVD present. No tracheal deviation present. No thyromegaly present.  Cardiovascular: Normal rate, regular rhythm and normal heart sounds.  Pulmonary/Chest: Effort normal and breath sounds normal. No stridor.  Abdominal: Bowel sounds are normal.  Lymphadenopathy:    She has no cervical adenopathy.  Neurological: She is alert and oriented to person, place, and time.  Skin: She is not diaphoretic.  Psychiatric: She has a normal mood and affect. Her behavior is normal.    BP 124/70   Pulse 95   Ht 5' 2" (1.575 m)   Wt 167 lb 8 oz (76 kg)   SpO2 98%   BMI 30.64 kg/m  Wt Readings from Last 3 Encounters:  01/26/19 167 lb 8 oz (76 kg)  08/26/18 149 lb (67.6 kg)  08/03/18 163 lb (73.9 kg)   BP Readings from Last 3 Encounters:  01/26/19 124/70  08/26/18 130/80  08/03/18 (!) 156/78   Guideline developer:  UpToDate (see UpToDate for funding source) Date Released: June 2014  Health Maintenance Due  Topic Date Due  . COLONOSCOPY  07/23/2005    There are no preventive care reminders to display for this patient.  Lab Results  Component Value Date   TSH 0.54 01/26/2019   Lab Results  Component Value Date   WBC 10.5 01/26/2019   HGB 12.3 01/26/2019   HCT 37.7 01/26/2019   MCV 95.5 01/26/2019   PLT 315.0 01/26/2019   Lab Results  Component Value Date   NA 139 01/26/2019   K 4.6 01/26/2019   CHLORIDE 110 (H) 01/09/2016   CO2 25 01/26/2019   GLUCOSE 87 01/26/2019   BUN 15 01/26/2019   CREATININE 0.62 01/26/2019   BILITOT 0.4 05/30/2018   ALKPHOS 75 05/30/2018   AST 16 05/30/2018   ALT 13 05/30/2018   PROT 6.3 05/30/2018   ALBUMIN 3.9 05/30/2018   CALCIUM 8.7  01/26/2019   ANIONGAP 9 01/09/2016   EGFR 76 (L) 01/09/2016   GFR 97.06 01/26/2019   Lab Results  Component Value Date   CHOL 154 05/30/2018   Lab Results  Component Value Date   HDL 66.80 05/30/2018   Lab Results  Component Value Date   LDLCALC 66 05/30/2018   Lab Results  Component Value Date   TRIG 109.0 05/30/2018   Lab Results  Component Value Date   CHOLHDL 2 05/30/2018   Lab Results  Component Value Date   HGBA1C 5.0 08/26/2018      Assessment & Plan:   Problem List Items Addressed This Visit      Endocrine   Hypothyroidism - Primary   Relevant Medications   levothyroxine (SYNTHROID) 200 MCG tablet   levothyroxine (SYNTHROID) 50 MCG tablet   Other Relevant Orders   TSH (Completed)     Other   Iron deficiency anemia   Relevant Medications   ferrous sulfate 325 (65 FE) MG EC tablet   Other Relevant Orders   CBC (Completed)   Iron, TIBC and Ferritin Panel (Completed)   B12 deficiency   Relevant Orders   Vitamin B12 (Completed)   Vitamin D deficiency   Relevant Medications   Vitamin D, Ergocalciferol, (DRISDOL) 1.25 MG (50000 UT) CAPS capsule   Other Relevant Orders   VITAMIN D 25 Hydroxy (Vit-D Deficiency, Fractures) (Completed)   Severe episode of recurrent major depressive disorder, without psychotic features (HCC)   Elevated glucose   Relevant Orders   Basic metabolic panel (Completed)      Meds ordered this encounter  Medications  . levothyroxine (SYNTHROID) 200 MCG tablet    Sig: Take 1 tablet (200 mcg total) by mouth daily before breakfast.    Dispense:  90 tablet    Refill:  1  . levothyroxine (SYNTHROID) 50 MCG tablet    Sig: Take 1 tablet (50 mcg total) by mouth daily.    Dispense:  90 tablet    Refill:  1  . Vitamin D, Ergocalciferol, (DRISDOL) 1.25 MG (50000 UT) CAPS capsule    Sig: Take one tablet twice weekly.    Dispense:  10 capsule    Refill:  2  . ferrous sulfate 325 (65 FE) MG EC tablet    Sig: Take 1 tablet (325 mg  total) by mouth daily with breakfast.    Dispense:  90 tablet    Refill:  0    Follow-up: Return in about 2 months (around 03/29/2019).    Congratulated laded her for retiring and wished her well.  Encouraged her to remain active.  7/10: Vitamin D levels actually returned lower.  Patient assures compliance on the weekly high-dose vitamin D.  And increase that dosage to twice daily.  Have added elemental iron for her therapy.  She will continue to take the multivitamin with iron.  Recheck in 2 months.

## 2019-01-26 NOTE — Patient Instructions (Signed)
Exercising to Stay Healthy To become healthy and stay healthy, it is recommended that you do moderate-intensity and vigorous-intensity exercise. You can tell that you are exercising at a moderate intensity if your heart starts beating faster and you start breathing faster but can still hold a conversation. You can tell that you are exercising at a vigorous intensity if you are breathing much harder and faster and cannot hold a conversation while exercising. Exercising regularly is important. It has many health benefits, such as:  Improving overall fitness, flexibility, and endurance.  Increasing bone density.  Helping with weight control.  Decreasing body fat.  Increasing muscle strength.  Reducing stress and tension.  Improving overall health. How often should I exercise? Choose an activity that you enjoy, and set realistic goals. Your health care provider can help you make an activity plan that works for you. Exercise regularly as told by your health care provider. This may include:  Doing strength training two times a week, such as: ? Lifting weights. ? Using resistance bands. ? Push-ups. ? Sit-ups. ? Yoga.  Doing a certain intensity of exercise for a given amount of time. Choose from these options: ? A total of 150 minutes of moderate-intensity exercise every week. ? A total of 75 minutes of vigorous-intensity exercise every week. ? A mix of moderate-intensity and vigorous-intensity exercise every week. Children, pregnant women, people who have not exercised regularly, people who are overweight, and older adults may need to talk with a health care provider about what activities are safe to do. If you have a medical condition, be sure to talk with your health care provider before you start a new exercise program. What are some exercise ideas? Moderate-intensity exercise ideas include:  Walking 1 mile (1.6 km) in about 15 minutes.  Biking.  Hiking.  Golfing.  Dancing.   Water aerobics. Vigorous-intensity exercise ideas include:  Walking 4.5 miles (7.2 km) or more in about 1 hour.  Jogging or running 5 miles (8 km) in about 1 hour.  Biking 10 miles (16.1 km) or more in about 1 hour.  Lap swimming.  Roller-skating or in-line skating.  Cross-country skiing.  Vigorous competitive sports, such as football, basketball, and soccer.  Jumping rope.  Aerobic dancing. What are some everyday activities that can help me to get exercise?  Yard work, such as: ? Pushing a lawn mower. ? Raking and bagging leaves.  Washing your car.  Pushing a stroller.  Shoveling snow.  Gardening.  Washing windows or floors. How can I be more active in my day-to-day activities?  Use stairs instead of an elevator.  Take a walk during your lunch break.  If you drive, park your car farther away from your work or school.  If you take public transportation, get off one stop early and walk the rest of the way.  Stand up or walk around during all of your indoor phone calls.  Get up, stretch, and walk around every 30 minutes throughout the day.  Enjoy exercise with a friend. Support to continue exercising will help you keep a regular routine of activity. What guidelines can I follow while exercising?  Before you start a new exercise program, talk with your health care provider.  Do not exercise so much that you hurt yourself, feel dizzy, or get very short of breath.  Wear comfortable clothes and wear shoes with good support.  Drink plenty of water while you exercise to prevent dehydration or heat stroke.  Work out until your breathing   and your heartbeat get faster. Where to find more information  [place] Department of Health and Human Services: www.hhs.gov  Centers for Disease Control and Prevention (CDC): www.cdc.gov Summary  Exercising regularly is important. It will improve your overall fitness, flexibility, and endurance.  Regular exercise also will  improve your overall health. It can help you control your weight, reduce stress, and improve your bone density.  Do not exercise so much that you hurt yourself, feel dizzy, or get very short of breath.  Before you start a new exercise program, talk with your health care provider. This information is not intended to replace advice given to you by your health care provider. Make sure you discuss any questions you have with your health care provider. Document Released: 08/08/2010 Document Revised: 06/18/2017 Document Reviewed: 05/27/2017 Elsevier Patient Education  2020 Elsevier Inc.  

## 2019-01-27 LAB — IRON,TIBC AND FERRITIN PANEL
%SAT: 15 % (calc) — ABNORMAL LOW (ref 16–45)
Ferritin: 18 ng/mL (ref 16–288)
Iron: 59 ug/dL (ref 45–160)
TIBC: 406 mcg/dL (calc) (ref 250–450)

## 2019-01-27 MED ORDER — LEVOTHYROXINE SODIUM 50 MCG PO TABS: 50.0000 ug | ORAL_TABLET | Freq: Every day | ORAL | 1 refills | Status: DC

## 2019-01-27 MED ORDER — LEVOTHYROXINE SODIUM 200 MCG PO TABS: 200.0000 ug | ORAL_TABLET | Freq: Every day | ORAL | 1 refills | Status: DC

## 2019-01-27 MED ORDER — VITAMIN D (ERGOCALCIFEROL) 1.25 MG (50000 UNIT) PO CAPS: ORAL_CAPSULE | ORAL | 2 refills | Status: DC

## 2019-01-27 MED ORDER — FERROUS SULFATE 325 (65 FE) MG PO TBEC: 325.0000 mg | DELAYED_RELEASE_TABLET | Freq: Every day | ORAL | 0 refills | Status: DC

## 2019-01-27 NOTE — Addendum Note (Signed)
Addended by: Jon Billings on: 01/27/2019 03:54 PM   Modules accepted: Orders

## 2019-02-01 ENCOUNTER — Ambulatory Visit: Payer: 59 | Admitting: Neurology

## 2019-02-15 ENCOUNTER — Other Ambulatory Visit: Payer: Self-pay | Admitting: Family Medicine

## 2019-02-24 ENCOUNTER — Ambulatory Visit: Payer: 59 | Admitting: Family Medicine

## 2019-02-27 ENCOUNTER — Ambulatory Visit: Payer: 59 | Admitting: Family Medicine

## 2019-03-28 ENCOUNTER — Other Ambulatory Visit: Payer: Self-pay | Admitting: Family Medicine

## 2019-04-18 ENCOUNTER — Encounter: Payer: Self-pay | Admitting: Gynecology

## 2019-07-06 ENCOUNTER — Encounter: Payer: 59 | Admitting: Gynecology

## 2019-08-11 ENCOUNTER — Other Ambulatory Visit: Payer: Self-pay

## 2019-10-20 ENCOUNTER — Ambulatory Visit: Payer: BC Managed Care – PPO | Attending: Internal Medicine

## 2019-10-20 DIAGNOSIS — Z23 Encounter for immunization: Secondary | ICD-10-CM

## 2019-10-20 NOTE — Progress Notes (Signed)
   Covid-19 Vaccination Clinic  Name:  Renee [name]    MRN: [id number] DOB: [birth year]  10/20/2019  Ms. Renee [name] was observed post Covid-19 immunization for 15 minutes without incident. She was provided with Vaccine Information Sheet and instruction to access the V-Safe system.   Ms. Renee [name] was instructed to call 911 with any severe reactions post vaccine: Marland Kitchen Difficulty breathing  . Swelling of face and throat  . A fast heartbeat  . A bad rash all over body  . Dizziness and weakness   Immunizations Administered    Name Date Dose VIS Date Route   Pfizer COVID-19 Vaccine 10/20/2019  9:47 AM 0.3 mL 06/30/2019 Intramuscular   Manufacturer: [employer], [employer]   Lot: [id number]   NDC: [address]-5

## 2019-11-14 ENCOUNTER — Ambulatory Visit: Payer: BC Managed Care – PPO | Attending: Internal Medicine

## 2019-11-14 ENCOUNTER — Ambulatory Visit: Payer: BC Managed Care – PPO

## 2019-11-14 DIAGNOSIS — Z23 Encounter for immunization: Secondary | ICD-10-CM

## 2019-11-14 NOTE — Progress Notes (Signed)
   Covid-19 Vaccination Clinic  Name:  Renee [name]    MRN: [id number] DOB: [birth year]  11/14/2019  [name] was observed post Covid-19 immunization for 15 minutes without incident. She was provided with Vaccine Information Sheet and instruction to access the V-Safe system.   [name] was instructed to call 911 with any severe reactions post vaccine: Marland Kitchen Difficulty breathing  . Swelling of face and throat  . A fast heartbeat  . A bad rash all over body  . Dizziness and weakness   Immunizations Administered    Name Date Dose VIS Date Route   Pfizer COVID-19 Vaccine 11/14/2019  8:16 AM 0.3 mL 09/13/2018 Intramuscular   Manufacturer: [employer], [employer]   Lot: [id number]   NDC: [address]-8

## 2022-12-01 ENCOUNTER — Encounter: Payer: Self-pay | Admitting: Hematology

## 2022-12-01 ENCOUNTER — Other Ambulatory Visit: Payer: Self-pay | Admitting: Orthopaedic Surgery

## 2022-12-01 ENCOUNTER — Ambulatory Visit
Admission: RE | Admit: 2022-12-01 | Discharge: 2022-12-01 | Disposition: A | Discharge status: Home / Self Care | Payer: Medicare Other | Source: Ambulatory Visit | Attending: Orthopaedic Surgery | Admitting: Orthopaedic Surgery

## 2022-12-01 DIAGNOSIS — M25512 Pain in left shoulder: Secondary | ICD-10-CM

## 2023-03-08 ENCOUNTER — Observation Stay (HOSPITAL_COMMUNITY)
Admission: EM | Admit: 2023-03-08 | Discharge: 2023-03-10 | Disposition: A | Discharge status: Home / Self Care | Payer: Medicare Other | Attending: Internal Medicine | Admitting: Internal Medicine

## 2023-03-08 ENCOUNTER — Emergency Department (HOSPITAL_COMMUNITY): Payer: Medicare Other

## 2023-03-08 ENCOUNTER — Other Ambulatory Visit: Payer: Self-pay

## 2023-03-08 ENCOUNTER — Encounter (HOSPITAL_COMMUNITY): Payer: Self-pay

## 2023-03-08 DIAGNOSIS — Z9884 Bariatric surgery status: Secondary | ICD-10-CM | POA: Insufficient documentation

## 2023-03-08 DIAGNOSIS — K529 Noninfective gastroenteritis and colitis, unspecified: Principal | ICD-10-CM | POA: Diagnosis present

## 2023-03-08 DIAGNOSIS — D509 Iron deficiency anemia, unspecified: Secondary | ICD-10-CM | POA: Diagnosis not present

## 2023-03-08 DIAGNOSIS — R3129 Other microscopic hematuria: Secondary | ICD-10-CM | POA: Insufficient documentation

## 2023-03-08 DIAGNOSIS — R079 Chest pain, unspecified: Secondary | ICD-10-CM | POA: Diagnosis present

## 2023-03-08 DIAGNOSIS — E119 Type 2 diabetes mellitus without complications: Secondary | ICD-10-CM | POA: Diagnosis not present

## 2023-03-08 DIAGNOSIS — R0789 Other chest pain: Secondary | ICD-10-CM | POA: Insufficient documentation

## 2023-03-08 DIAGNOSIS — E039 Hypothyroidism, unspecified: Secondary | ICD-10-CM | POA: Diagnosis not present

## 2023-03-08 DIAGNOSIS — Z79899 Other long term (current) drug therapy: Secondary | ICD-10-CM | POA: Insufficient documentation

## 2023-03-08 DIAGNOSIS — Z96641 Presence of right artificial hip joint: Secondary | ICD-10-CM | POA: Diagnosis not present

## 2023-03-08 DIAGNOSIS — R1032 Left lower quadrant pain: Secondary | ICD-10-CM | POA: Diagnosis present

## 2023-03-08 DIAGNOSIS — E860 Dehydration: Secondary | ICD-10-CM | POA: Diagnosis present

## 2023-03-08 DIAGNOSIS — D508 Other iron deficiency anemias: Secondary | ICD-10-CM

## 2023-03-08 LAB — BASIC METABOLIC PANEL
Anion gap: 14 (ref 5–15)
BUN: 8 mg/dL (ref 8–23)
CO2: 23 mmol/L (ref 22–32)
Calcium: 9.1 mg/dL (ref 8.9–10.3)
Chloride: 104 mmol/L (ref 98–111)
Creatinine, Ser: 0.86 mg/dL (ref 0.44–1.00)
GFR, Estimated: >60 mL/min (ref >60)
Glucose, Bld: 103 mg/dL — ABNORMAL HIGH (ref 70–99)
Potassium: 4.1 mmol/L (ref 3.5–5.1)
Sodium: 141 mmol/L (ref 135–145)

## 2023-03-08 LAB — CBC
HCT: 35.3 % — ABNORMAL LOW (ref 36.0–46.0)
Hemoglobin: 10.9 g/dL — ABNORMAL LOW (ref 12.0–15.0)
MCH: 29.5 pg (ref 26.0–34.0)
MCHC: 30.9 g/dL (ref 30.0–36.0)
MCV: 95.7 fL (ref 80.0–100.0)
Platelets: 183 10*3/uL (ref 150–400)
RBC: 3.69 MIL/uL — ABNORMAL LOW (ref 3.87–5.11)
RDW: 12.9 % (ref 11.5–15.5)
WBC: 14.2 10*3/uL — ABNORMAL HIGH (ref 4.0–10.5)
nRBC: 0 % (ref 0.0–0.2)

## 2023-03-08 LAB — I-STAT VENOUS BLOOD GAS, ED
Acid-base deficit: 3 mmol/L — ABNORMAL HIGH (ref 0.0–2.0)
Bicarbonate: 17.9 mmol/L — ABNORMAL LOW (ref 20.0–28.0)
Calcium, Ion: 0.75 mmol/L — CL (ref 1.15–1.40)
HCT: 33 % — ABNORMAL LOW (ref 36.0–46.0)
Hemoglobin: 11.2 g/dL — ABNORMAL LOW (ref 12.0–15.0)
O2 Saturation: 99 %
Potassium: 4.7 mmol/L (ref 3.5–5.1)
Sodium: 137 mmol/L (ref 135–145)
TCO2: 19 mmol/L — ABNORMAL LOW (ref 22–32)
pCO2, Ven: 21.5 mmHg — ABNORMAL LOW (ref 44–60)
pH, Ven: 7.528 — ABNORMAL HIGH (ref 7.25–7.43)
pO2, Ven: 127 mmHg — ABNORMAL HIGH (ref 32–45)

## 2023-03-08 LAB — HEPATIC FUNCTION PANEL
ALT: 21 U/L (ref 0–44)
AST: 23 U/L (ref 15–41)
Albumin: 3.4 g/dL — ABNORMAL LOW (ref 3.5–5.0)
Alkaline Phosphatase: 66 U/L (ref 38–126)
Bilirubin, Direct: 0.1 mg/dL (ref 0.0–0.2)
Indirect Bilirubin: 0 mg/dL — ABNORMAL LOW (ref 0.3–0.9)
Total Bilirubin: 0.1 mg/dL — ABNORMAL LOW (ref 0.3–1.2)
Total Protein: 6.3 g/dL — ABNORMAL LOW (ref 6.5–8.1)

## 2023-03-08 LAB — MAGNESIUM: Magnesium: 1.7 mg/dL (ref 1.7–2.4)

## 2023-03-08 LAB — URINALYSIS, ROUTINE W REFLEX MICROSCOPIC
Bilirubin Urine: NEGATIVE
Glucose, UA: NEGATIVE mg/dL
Ketones, ur: 5 mg/dL — AB
Leukocytes,Ua: NEGATIVE
Nitrite: NEGATIVE
Protein, ur: NEGATIVE mg/dL
Specific Gravity, Urine: 1.02 (ref 1.005–1.030)
pH: 5 (ref 5.0–8.0)

## 2023-03-08 LAB — TROPONIN I (HIGH SENSITIVITY)
Troponin I (High Sensitivity): 19 ng/L — ABNORMAL HIGH (ref <18)
Troponin I (High Sensitivity): 85 ng/L — ABNORMAL HIGH (ref <18)
Troponin I (High Sensitivity): 204 ng/L (ref <18)
Troponin I (High Sensitivity): 242 ng/L (ref <18)

## 2023-03-08 LAB — RETICULOCYTES
Immature Retic Fract: 16.3 % — ABNORMAL HIGH (ref 2.3–15.9)
RBC.: 4.06 MIL/uL (ref 3.87–5.11)
Retic Count, Absolute: 58.5 10*3/uL (ref 19.0–186.0)
Retic Ct Pct: 1.4 % (ref 0.4–3.1)

## 2023-03-08 LAB — LACTIC ACID, PLASMA
Lactic Acid, Venous: 2.2 mmol/L (ref 0.5–1.9)
Lactic Acid, Venous: 1.6 mmol/L (ref 0.5–1.9)

## 2023-03-08 LAB — COMPREHENSIVE METABOLIC PANEL
ALT: 15 U/L (ref 0–44)
AST: 28 U/L (ref 15–41)
Albumin: 2.9 g/dL — ABNORMAL LOW (ref 3.5–5.0)
Alkaline Phosphatase: 57 U/L (ref 38–126)
Anion gap: 13 (ref 5–15)
BUN: 11 mg/dL (ref 8–23)
CO2: 15 mmol/L — ABNORMAL LOW (ref 22–32)
Calcium: 7.2 mg/dL — ABNORMAL LOW (ref 8.9–10.3)
Chloride: 112 mmol/L — ABNORMAL HIGH (ref 98–111)
Creatinine, Ser: 0.89 mg/dL (ref 0.44–1.00)
GFR, Estimated: >60 mL/min (ref >60)
Glucose, Bld: 177 mg/dL — ABNORMAL HIGH (ref 70–99)
Potassium: 3.7 mmol/L (ref 3.5–5.1)
Sodium: 140 mmol/L (ref 135–145)
Total Bilirubin: 0.8 mg/dL (ref 0.3–1.2)
Total Protein: 5.5 g/dL — ABNORMAL LOW (ref 6.5–8.1)

## 2023-03-08 LAB — IRON AND TIBC
Iron: 21 ug/dL — ABNORMAL LOW (ref 28–170)
Saturation Ratios: 4 % — ABNORMAL LOW (ref 10.4–31.8)
TIBC: 486 ug/dL — ABNORMAL HIGH (ref 250–450)
UIBC: 465 ug/dL

## 2023-03-08 LAB — PROCALCITONIN: Procalcitonin: <0.1 ng/mL

## 2023-03-08 LAB — FERRITIN: Ferritin: 6 ng/mL — ABNORMAL LOW (ref 11–307)

## 2023-03-08 LAB — PHOSPHORUS: Phosphorus: 3.5 mg/dL (ref 2.5–4.6)

## 2023-03-08 LAB — CK: Total CK: 78 U/L (ref 38–234)

## 2023-03-08 LAB — LIPASE, BLOOD: Lipase: 20 U/L (ref 11–51)

## 2023-03-08 LAB — TSH: TSH: 0.282 u[IU]/mL — ABNORMAL LOW (ref 0.350–4.500)

## 2023-03-08 MED ORDER — FENTANYL CITRATE PF 50 MCG/ML IJ SOSY: 12.5000 ug | PREFILLED_SYRINGE | INTRAMUSCULAR | Status: DC | PRN

## 2023-03-08 MED ORDER — ACETAMINOPHEN 650 MG RE SUPP: 650.0000 mg | Freq: Four times a day (QID) | RECTAL | Status: DC | PRN

## 2023-03-08 MED ORDER — BUPROPION HCL ER (XL) 150 MG PO TB24
150.0000 mg | ORAL_TABLET | Freq: Every day | ORAL | Status: DC
  Administered 2023-03-09 (×2): 150 mg via ORAL
  Filled 2023-03-08 (×2): qty 1

## 2023-03-08 MED ORDER — CIPROFLOXACIN IN D5W 400 MG/200ML IV SOLN
400.0000 mg | Freq: Two times a day (BID) | INTRAVENOUS | Status: DC
  Administered 2023-03-09 – 2023-03-10 (×3): 400 mg via INTRAVENOUS
  Filled 2023-03-08 (×4): qty 200

## 2023-03-08 MED ORDER — LEVOTHYROXINE SODIUM 50 MCG PO TABS
50.0000 ug | ORAL_TABLET | Freq: Every day | ORAL | Status: DC
  Administered 2023-03-09 – 2023-03-10 (×2): 50 ug via ORAL
  Filled 2023-03-08 (×2): qty 1

## 2023-03-08 MED ORDER — ONDANSETRON HCL 4 MG/2ML IJ SOLN: 4.0000 mg | Freq: Four times a day (QID) | INTRAMUSCULAR | Status: DC | PRN

## 2023-03-08 MED ORDER — MORPHINE SULFATE (PF) 4 MG/ML IV SOLN
4.0000 mg | Freq: Once | INTRAVENOUS | Status: AC
  Administered 2023-03-08: 4 mg via INTRAVENOUS
  Filled 2023-03-08: qty 1

## 2023-03-08 MED ORDER — SODIUM CHLORIDE 0.9 % IV SOLN: INTRAVENOUS | Status: AC

## 2023-03-08 MED ORDER — CALCIUM GLUCONATE-NACL 1-0.675 GM/50ML-% IV SOLN
1.0000 g | Freq: Once | INTRAVENOUS | Status: AC
  Administered 2023-03-08: 1000 mg via INTRAVENOUS
  Filled 2023-03-08: qty 50

## 2023-03-08 MED ORDER — IOHEXOL 350 MG/ML SOLN
75.0000 mL | Freq: Once | INTRAVENOUS | Status: AC | PRN
  Administered 2023-03-08: 75 mL via INTRAVENOUS

## 2023-03-08 MED ORDER — PAROXETINE HCL 20 MG PO TABS
40.0000 mg | ORAL_TABLET | Freq: Every day | ORAL | Status: DC
  Administered 2023-03-09 (×2): 40 mg via ORAL
  Filled 2023-03-08 (×3): qty 2

## 2023-03-08 MED ORDER — LACTATED RINGERS IV BOLUS
1000.0000 mL | Freq: Once | INTRAVENOUS | Status: AC
  Administered 2023-03-08: 1000 mL via INTRAVENOUS

## 2023-03-08 MED ORDER — ACETAMINOPHEN 325 MG PO TABS: 650.0000 mg | ORAL_TABLET | Freq: Four times a day (QID) | ORAL | Status: DC | PRN

## 2023-03-08 MED ORDER — HYDROCODONE-ACETAMINOPHEN 5-325 MG PO TABS: 1.0000 | ORAL_TABLET | ORAL | Status: DC | PRN

## 2023-03-08 MED ORDER — CIPROFLOXACIN IN D5W 400 MG/200ML IV SOLN
400.0000 mg | Freq: Once | INTRAVENOUS | Status: AC
  Administered 2023-03-08: 400 mg via INTRAVENOUS
  Filled 2023-03-08: qty 200

## 2023-03-08 MED ORDER — ONDANSETRON HCL 4 MG/2ML IJ SOLN
4.0000 mg | Freq: Once | INTRAMUSCULAR | Status: AC | PRN
  Administered 2023-03-08: 4 mg via INTRAVENOUS
  Filled 2023-03-08: qty 2

## 2023-03-08 MED ORDER — ONDANSETRON HCL 4 MG PO TABS: 4.0000 mg | ORAL_TABLET | Freq: Four times a day (QID) | ORAL | Status: DC | PRN

## 2023-03-08 MED ORDER — METRONIDAZOLE 500 MG/100ML IV SOLN
500.0000 mg | Freq: Two times a day (BID) | INTRAVENOUS | Status: DC
  Administered 2023-03-09 – 2023-03-10 (×3): 500 mg via INTRAVENOUS
  Filled 2023-03-08 (×3): qty 100

## 2023-03-08 MED ORDER — METRONIDAZOLE 500 MG/100ML IV SOLN
500.0000 mg | Freq: Once | INTRAVENOUS | Status: AC
  Administered 2023-03-08: 500 mg via INTRAVENOUS
  Filled 2023-03-08: qty 100

## 2023-03-08 MED ORDER — DIPHENHYDRAMINE HCL 50 MG/ML IJ SOLN
25.0000 mg | Freq: Once | INTRAMUSCULAR | Status: AC
  Administered 2023-03-08: 25 mg via INTRAVENOUS
  Filled 2023-03-08: qty 1

## 2023-03-08 MED ORDER — CYANOCOBALAMIN 500 MCG PO TABS
500.0000 ug | ORAL_TABLET | Freq: Every day | ORAL | Status: DC
  Filled 2023-03-08: qty 1

## 2023-03-08 MED ORDER — LEVOTHYROXINE SODIUM 100 MCG PO TABS: 200.0000 ug | ORAL_TABLET | ORAL | Status: DC

## 2023-03-08 MED ORDER — LEVOTHYROXINE SODIUM 100 MCG PO TABS
200.0000 ug | ORAL_TABLET | ORAL | Status: DC
  Administered 2023-03-10: 200 ug via ORAL
  Filled 2023-03-08: qty 2

## 2023-03-08 NOTE — Assessment & Plan Note (Signed)
Replaced in the ER

## 2023-03-08 NOTE — ED Notes (Signed)
EDT was transferring pt to another chair when pt had a syncopal episode and she assisted pt to the floor in a lateral position. Pt noted to have a weak radial pulse. Pt regained consciousness, A/Ox4 and staff placed pt in stretcher.

## 2023-03-08 NOTE — H&P (Signed)
Renee [name] WJX:[id number] DOB: 11-16-1955 DOA: 03/08/2023     PCP: Patient, No Pcp Per   Outpatient Specialists:    orthopedics  Patient arrived to ER on 03/08/23 at 1419 Referred by Attending Laurence Spates, MD   Patient coming from:    home Lives   With family    Chief Complaint: abd pain   HPI: Renee [name] is a 67 y.o. female with medical history significant of  hypothyroidism, depression   gastric lap band 2017 and Gastric bypass 2014  Presented with   abd pain Hx of abd pain and diarrhea had associated CP Non bloody dairrhea severe LLLQ pain CP was substernal non radaiting No fever no chills  No hx of stress test Reports CP was sharp lightning bolt strikes For the past 1 m Worse when laying down at night Only had coffee and cream pie in AM regular No exposure to questionable food No sick contacts But her 6 yo granddaughter did visit she has not been sick She has well water but they drink bottled water  Denies significant ETOH intake   Does not smoke   No results found for: "[id number]"      Regarding pertinent Chronic problems:    Hypothyroidism:   Lab Results  Component Value Date   TSH 0.54 01/26/2019   on synthroid   anemia - baseline hg Hemoglobin & Hematocrit  Recent Labs    03/08/23 1457 03/08/23 1729  HGB 10.9* 11.2*   Iron/TIBC/Ferritin/ %Sat    Component Value Date/Time   IRON 59 01/26/2019 0921   IRON 68 08/08/2015 1305   TIBC 406 01/26/2019 0921   TIBC 301 08/08/2015 1305   FERRITIN 18 01/26/2019 0921   FERRITIN 122 01/09/2016 1453   IRONPCTSAT 15 (L) 01/26/2019 0921     While in ER: Clinical Course as of 03/08/23 1925  Mon Mar 08, 2023  1536 Minor local reaction after administration of medications.  Slight skin change and itching.  No signs of anaphylaxis.  Benadryl ordered. [JD]  1835 Nishan: trend trop [JD]    Clinical Course User Index [JD] Laurence Spates, MD        Lab Orders         Lipase, blood          Comprehensive metabolic panel         CBC         Urinalysis, Routine w reflex microscopic -Urine, Clean Catch         Lactic acid, plasma         I-Stat venous blood gas, ([facility], [facility], [facility])       CXR -  NON acute  CTabd/pelvis -  distal descending colon, mid sigmoid colon, and rectosigmoid junction, compatible with colitis.    Following Medications were ordered in ER: Medications  ciprofloxacin (CIPRO) IVPB 400 mg (has no administration in time range)  metroNIDAZOLE (FLAGYL) IVPB 500 mg (500 mg Intravenous New Bag/Given 03/08/23 1852)  ondansetron (ZOFRAN) injection 4 mg (4 mg Intravenous Given 03/08/23 1527)  morphine (PF) 4 MG/ML injection 4 mg (4 mg Intravenous Given 03/08/23 1528)  lactated ringers bolus 1,000 mL (0 mLs Intravenous Stopped 03/08/23 1652)  diphenhydrAMINE (BENADRYL) injection 25 mg (25 mg Intravenous Given 03/08/23 1540)  iohexol (OMNIPAQUE) 350 MG/ML injection 75 mL (75 mLs Intravenous Contrast Given 03/08/23 1637)  calcium gluconate 1 g/ 50 mL sodium chloride IVPB (0 mg Intravenous Stopped 03/08/23 1853)    _______________________________________________________ ER  Provider Called:      Cardiology    They Recommend admit to medicine     ED Triage Vitals  Encounter Vitals Group     BP 03/08/23 1426 (!) 159/77     Systolic BP Percentile --      Diastolic BP Percentile --      Pulse Rate 03/08/23 1426 (!) 49     Resp 03/08/23 1426 17     Temp 03/08/23 1426 (!) 97.4 F (36.3 C)     Temp src --      SpO2 03/08/23 1426 100 %     Weight 03/08/23 1422 160 lb (72.6 kg)     Height 03/08/23 1422 5\' 2"  (1.575 m)     Head Circumference --      Peak Flow --      Pain Score 03/08/23 1422 10     Pain Loc --      Pain Education --      Exclude from Growth Chart --   MVHQ(46)@     _________________________________________ Significant initial  Findings: Abnormal Labs Reviewed  COMPREHENSIVE METABOLIC PANEL - Abnormal; Notable for the following components:       Result Value   Chloride 112 (*)    CO2 15 (*)    Glucose, Bld 177 (*)    Calcium 7.2 (*)    Total Protein 5.5 (*)    Albumin 2.9 (*)    All other components within normal limits  CBC - Abnormal; Notable for the following components:   WBC 14.2 (*)    RBC 3.69 (*)    Hemoglobin 10.9 (*)    HCT 35.3 (*)    All other components within normal limits  URINALYSIS, ROUTINE W REFLEX MICROSCOPIC - Abnormal; Notable for the following components:   Hgb urine dipstick MODERATE (*)    Ketones, ur 5 (*)    Bacteria, UA RARE (*)    All other components within normal limits  LACTIC ACID, PLASMA - Abnormal; Notable for the following components:   Lactic Acid, Venous 2.2 (*)    All other components within normal limits  I-STAT VENOUS BLOOD GAS, ED - Abnormal; Notable for the following components:   pH, Ven 7.528 (*)    pCO2, Ven 21.5 (*)    pO2, Ven 127 (*)    Bicarbonate 17.9 (*)    TCO2 19 (*)    Acid-base deficit 3.0 (*)    Calcium, Ion 0.75 (*)    HCT 33.0 (*)    Hemoglobin 11.2 (*)    All other components within normal limits  TROPONIN I (HIGH SENSITIVITY) - Abnormal; Notable for the following components:   Troponin I (High Sensitivity) 19 (*)    All other components within normal limits  TROPONIN I (HIGH SENSITIVITY) - Abnormal; Notable for the following components:   Troponin I (High Sensitivity) 85 (*)    All other components within normal limits    _________________________ Troponin  ordered Cardiac Panel (last 3 results) Recent Labs    03/08/23 1457 03/08/23 1703  TROPONINIHS 19* 85*     ECG: Ordered Personally reviewed and interpreted by me showing: HR : 66 Rhythm: Sinus or ectopic atrial rhythm Borderline left axis deviation  QTC 437    The recent clinical data is shown below. Vitals:   03/08/23 1426 03/08/23 1439 03/08/23 1530 03/08/23 1600  BP: (!) 159/77 (!) 151/58 127/66 (!) 145/73  Pulse: (!) 49 (!) 50 69 71  Resp: 17 18 18  18  Temp: (!) 97.4 F (36.3 C)  (!) 97.4 F (36.3 C)    SpO2: 100% 100% 98% 100%  Weight:      Height:        WBC     Component Value Date/Time   WBC 14.2 (H) 03/08/2023 1457   LYMPHSABS 2.1 01/09/2016 1453   MONOABS 0.5 01/09/2016 1453   EOSABS 0.1 01/09/2016 1453   BASOSABS 0.0 01/09/2016 1453    Lactic Acid, Venous    Component Value Date/Time   LATICACIDVEN 2.2 (HH) 03/08/2023 1703      Procalcitonin   Ordered      UA   no evidence of UTI     Urine analysis:    Component Value Date/Time   COLORURINE YELLOW 03/08/2023 1719   APPEARANCEUR CLEAR 03/08/2023 1719   LABSPEC 1.020 03/08/2023 1719   PHURINE 5.0 03/08/2023 1719   GLUCOSEU NEGATIVE 03/08/2023 1719   GLUCOSEU NEGATIVE 05/30/2018 1032   HGBUR MODERATE (A) 03/08/2023 1719   BILIRUBINUR NEGATIVE 03/08/2023 1719   KETONESUR 5 (A) 03/08/2023 1719   PROTEINUR NEGATIVE 03/08/2023 1719   UROBILINOGEN 0.2 05/30/2018 1032   NITRITE NEGATIVE 03/08/2023 1719   LEUKOCYTESUR NEGATIVE 03/08/2023 1719    Results for orders placed or performed during the hospital encounter of 09/23/15  Surgical pcr screen     Status: None   Collection Time: 09/23/15 11:03 AM   Specimen: Nasal Mucosa; Nasal Swab  Result Value Ref Range Status   MRSA, PCR NEGATIVE NEGATIVE Final   Staphylococcus aureus NEGATIVE NEGATIVE Final    Comment:        The Xpert SA Assay (FDA approved for NASAL specimens in patients over 41 years of age), is one component of a comprehensive surveillance program.  Test performance has been validated by [facility] for patients greater than or equal to 57 year old. It is not intended to diagnose infection nor to guide or monitor treatment.   Urine culture     Status: None   Collection Time: 09/23/15 11:04 AM   Specimen: Urine, Clean Catch  Result Value Ref Range Status   Specimen Description URINE, CLEAN CATCH  Final   Special Requests NONE  Final   Culture NO GROWTH 1 DAY  Final   Report Status 09/24/2015 FINAL  Final    ABX  started Antibiotics Given (last 72 hours)     Date/Time Action Medication Dose Rate   03/08/23 1852 New Bag/Given   metroNIDAZOLE (FLAGYL) IVPB 500 mg 500 mg 100 mL/hr       __________________________________________________________ Recent Labs  Lab 03/08/23 1457 03/08/23 1729  NA 140 137  K 3.7 4.7  CO2 15*  --   GLUCOSE 177*  --   BUN 11  --   CREATININE 0.89  --   CALCIUM 7.2*  --     Cr   stable,    Lab Results  Component Value Date   CREATININE 0.89 03/08/2023   CREATININE 0.62 01/26/2019   CREATININE 0.69 11/02/2018    Recent Labs  Lab 03/08/23 1457  AST 28  ALT 15  ALKPHOS 57  BILITOT 0.8  PROT 5.5*  ALBUMIN 2.9*   Lab Results  Component Value Date   CALCIUM 7.2 (L) 03/08/2023    Plt: Lab Results  Component Value Date   PLT 183 03/08/2023       Recent Labs  Lab 03/08/23 1457 03/08/23 1729  WBC 14.2*  --   HGB 10.9* 11.2*  HCT 35.3* 33.0*  MCV 95.7  --   PLT 183  --     HG/HCT   stable,       Component Value Date/Time   HGB 11.2 (L) 03/08/2023 1729   HGB 13.2 01/09/2016 1453   HCT 33.0 (L) 03/08/2023 1729   HCT 41.1 01/09/2016 1453   MCV 95.7 03/08/2023 1457   MCV 93.2 01/09/2016 1453     Recent Labs  Lab 03/08/23 1457  LIPASE 20    __________________________________ Hospitalist was called for admission for colitis  The following Work up has been ordered so far:  Orders Placed This Encounter  Procedures   DG Chest 2 View   CT ABDOMEN PELVIS W CONTRAST   Lipase, blood   Comprehensive metabolic panel   CBC   Urinalysis, Routine w reflex microscopic -Urine, Clean Catch   Lactic acid, plasma   Diet NPO time specified   Inpatient consult to Cardiology   Consult to hospitalist   I-Stat venous blood gas, ([facility], [facility], [facility])   ED EKG   EKG 12-Lead   EKG 12-Lead     OTHER Significant initial  Findings:  labs showing:     DM  labs:  HbA1C: No results for input(s): "HGBA1C" in the last 8760 hours.     CBG (last 3)   No results for input(s): "GLUCAP" in the last 72 hours.        Cultures:    Component Value Date/Time   SDES URINE, CLEAN CATCH 09/23/2015 1104   SPECREQUEST NONE 09/23/2015 1104   CULT NO GROWTH 1 DAY 09/23/2015 1104   REPTSTATUS 09/24/2015 FINAL 09/23/2015 1104     Radiological Exams on Admission: CT ABDOMEN PELVIS W CONTRAST  Result Date: 03/08/2023 CLINICAL DATA:  Left lower quadrant pain. Vomiting. Diarrhea. Lower abdominal pain radiating to the left flank and back. EXAM: CT ABDOMEN AND PELVIS WITH CONTRAST TECHNIQUE: Multidetector CT imaging of the abdomen and pelvis was performed using the standard protocol following bolus administration of intravenous contrast. RADIATION DOSE REDUCTION: This exam was performed according to the departmental dose-optimization program which includes automated exposure control, adjustment of the mA and/or kV according to patient size and/or use of iterative reconstruction technique. CONTRAST:  75mL OMNIPAQUE IOHEXOL 350 MG/ML SOLN COMPARISON:  None Available. FINDINGS: Lower chest: No acute abnormality.  Heart size is normal. Hepatobiliary: Smooth liver contours. No focal liver lesion is seen. Status post cholecystectomy. The common bile duct measures up to approximately 10 mm in caliber, mildly enlarged and favored to be secondary to postsurgical reservoir effect. Trace central intrahepatic biliary ductal dilatation is also likely postsurgical from prior cholecystectomy. Pancreas: Unremarkable. No pancreatic ductal dilatation or surrounding inflammatory changes. Spleen: Normal in size without focal abnormality. Tiny normal variant splenule inferior to the spleen. Adrenals/Urinary Tract: Normal bilateral adrenals. The kidneys are symmetric in size without hydronephrosis a subtle region of mild hypoperfusion within the lateral aspect of the right renal midpole (axial series 2, image 37 and coronal series 6, image 63) may represent mild prominence of the regional  calyx due to mildly thinned underlying cortex, however this hypoperfusion mildly persists into the delayed phase images (axial series 8, image 16), and may represent mild acute pyelonephritis. Recommend correlation with urinalysis. There is symmetric excretion of contrast into the proximal ureters on the delayed phase images. Within limitations of streak artifact from total right hip arthroplasty hardware, no urinary bladder wall thickening is seen. Stomach/Bowel: There is mild wall thickening of portions of the distal descending colon (coronal  images 70 through 77 and axial series 3 images 44 through 63) separate portions of a mid sigmoid colon (coronal image 93 and axial series 3 images 66 through 70) and the a rectosigmoid junction (axial images 60 through 75 and coronal images 33 through 50). This is compatible with colitis. No high-grade diverticula are seen throughout all the regions of this wall thickening and suspected inflammatory change, however there is a small diverticulum seen at the lateral aspect of the distal descending colon that is included within the regions of colonic wall thickening (axial series 3, image 54 and coronal series 6, image 74). There is fluid within the terminal ileum but no wall thickening to suggest inflammatory change. The appendix is not confidently identified, however no inflammatory changes are seen around the cecum to indicate secondary signs of acute appendicitis. Postsurgical changes are seen of a gastric lap band which appears grossly in appropriate position. Associated left anterior abdominal wall port also appears in appropriate position. The there are surgical clips around the stomach and surgical suture near the gastroesophageal junction above the gastric band. Additional surgical suture is seen within the region of the mid duodenum, and the more distal duodenum does not appear to cross the midline and likely due to postsurgical change. No dilated loops of small bowel  are seen to indicate bowel obstruction. Vascular/Lymphatic: No significant vascular findings are present. No enlarged abdominal or pelvic lymph nodes. Mild atherosclerotic calcifications. Reproductive: The uterus is present.  No gross adnexal mass is seen. Other: No ventral abdominal wall hernia. No free air or free fluid is seen within the abdomen or pelvis. Musculoskeletal: Status post total right hip arthroplasty with associated streak artifact. Minimal dextrocurvature centered at L3. Severe left L3-4 and moderate to severe right L4-5 degenerative disc and endplate changes. IMPRESSION: 1. Mild wall thickening of portions of the distal descending colon, mid sigmoid colon, and rectosigmoid junction, compatible with colitis. There is a single small diverticulum in the region of a distal descending colon wall thickening inflammatory change. Otherwise, no significant diverticulitis. 2. Subtle region of mild low-density within the lateral aspect of the right renal midpole may represent artifact or mild prominence of the regional calyx due to mildly thinned underlying cortex. However, this low-density mildly persists into the delayed phase images possibly representing mild hypoperfusion, and may represent mild acute pyelonephritis. Recommend correlation with urinalysis. 3. Postsurgical changes are seen of a gastric lap band which appears grossly in appropriate position. No evidence of bowel obstruction. Electronically Signed   By: Neita Garnet M.D.   On: 03/08/2023 17:55   DG Chest 2 View  Result Date: 03/08/2023 CLINICAL DATA:  Chest pain. EXAM: CHEST - 2 VIEW COMPARISON:  Chest radiographs 04/20/2017 FINDINGS: Cardiac silhouette and mediastinal contours are within limits. Mildly decreased lung volumes. The lungs are clear. No pleural effusion pneumothorax. Mild multilevel degenerative disc changes of the thoracic spine. IMPRESSION: No active cardiopulmonary disease. Electronically Signed   By: Neita Garnet M.D.    On: 03/08/2023 17:22   _______________________________________________________________________________________________________ Latest  Blood pressure (!) 145/73, pulse 71, temperature (!) 97.4 F (36.3 C), resp. rate 18, height 5\' 2"  (1.575 m), weight 72.6 kg, SpO2 100%.   Vitals  labs and radiology finding personally reviewed  Review of Systems:    Pertinent positives include:  fatigue, abdominal pain, nausea, vomiting, diarrhea chest pain,  Constitutional:  No weight loss, night sweats, Fevers, chills, weight loss  HEENT:  No headaches, Difficulty swallowing,Tooth/dental problems,Sore throat,  No sneezing, itching,  ear ache, nasal congestion, post nasal drip,  Cardio-vascular:  No Orthopnea, PND, anasarca, dizziness, palpitations.no Bilateral lower extremity swelling  GI:  No heartburn, indigestion, , change in bowel habits, loss of appetite, melena, blood in stool, hematemesis Resp:  no shortness of breath at rest. No dyspnea on exertion, No excess mucus, no productive cough, No non-productive cough, No coughing up of blood.No change in color of mucus.No wheezing. Skin:  no rash or lesions. No jaundice GU:  no dysuria, change in color of urine, no urgency or frequency. No straining to urinate.  No flank pain.  Musculoskeletal:  No joint pain or no joint swelling. No decreased range of motion. No back pain.  Psych:  No change in mood or affect. No depression or anxiety. No memory loss.  Neuro: no localizing neurological complaints, no tingling, no weakness, no double vision, no gait abnormality, no slurred speech, no confusion  All systems reviewed and apart from HOPI all are negative _______________________________________________________________________________________________ Past Medical History:   Past Medical History:  Diagnosis Date   Anemia    hx   Arthritis    Depression    DM type 2 (diabetes mellitus, type 2) (HCC)    Gastric bypass status for obesity    h/o  gastric bypass surgery followed by lap band   Hypercholesterolemia    Hypothyroidism    Mood swings    Osteopenia    on BMD in 07/2012, repeat in 2 yrs    Past Surgical History:  Procedure Laterality Date   bunions Bilateral 08   CESAREAN SECTION     31 yrs ago   CHOLECYSTECTOMY     COLONOSCOPY     GASTRIC BYPASS  02   HAMMER TOE SURGERY Bilateral 08   HIP FRACTURE SURGERY Right 29 yrs ago   screws   KNEE ARTHROSCOPY WITH ANTERIOR CRUCIATE LIGAMENT (ACL) REPAIR Right    28 yrs ago   LAPAROSCOPIC GASTRIC BANDING  11   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 She has never used smokeless tobacco. She reports that she does not drink alcohol and does not use drugs.     Family History:   Family History  Problem Relation Age of Onset   Depression Mother    Drug abuse Mother    Early death Mother    Heart attack Mother    Hyperlipidemia Mother    Hypertension Mother    Mental illness Mother    Heart attack Father    Alcohol abuse Father    Arthritis Father    Heart disease Father    Hyperlipidemia Father    Hypertension Father    Alcohol abuse Sister    Diabetes Sister    Drug abuse Sister    Hyperlipidemia Sister    Hypertension Sister    Arthritis Daughter    Diabetes Daughter    Hyperlipidemia Daughter    Hypertension Daughter    Miscarriages / [place] Daughter    Arthritis Maternal Grandmother    Hearing loss Maternal Grandmother    Heart disease Maternal Grandmother    Hypertension Maternal Grandmother  Mental illness Sister     ______________________________________________________________________________________________ Allergies: Allergies  Allergen Reactions   Erythromycin Palpitations     Prior to Admission medications   Medication Sig Start Date End Date Taking? Authorizing Provider  buPROPion (WELLBUTRIN XL) 150 MG 24 hr tablet TAKE 1 TABLET BY MOUTH DAILY 02/15/19   Mliss Sax, MD  cholecalciferol (VITAMIN D3) 25 MCG (1000 UT) tablet Take 1,000 Units by mouth daily.    [provider]  estrogen, conjugated,-medroxyprogesterone (PREMPRO) 0.45-1.5 MG tablet Take 1 tablet by mouth daily. 07/06/18   Fontaine, Nadyne Coombes, MD  ferrous sulfate 325 (65 FE) MG EC tablet Take 1 tablet (325 mg total) by mouth daily with breakfast. 01/27/19   Mliss Sax, MD  levothyroxine (SYNTHROID) 200 MCG tablet Take 1 tablet (200 mcg total) by mouth daily before breakfast. 01/27/19   Mliss Sax, MD  levothyroxine (SYNTHROID) 50 MCG tablet Take 1 tablet (50 mcg total) by mouth daily. 01/27/19   Mliss Sax, MD  PARoxetine (PAXIL) 20 MG tablet TAKE 2 TABLETS BY MOUTH DAILY 03/28/19   Mliss Sax, MD    ___________________________________________________________________________________________________ Physical Exam:    03/08/2023    4:00 PM 03/08/2023    3:30 PM 03/08/2023    2:39 PM  Vitals with BMI  Systolic 145 127 440  Diastolic 73 66 58  Pulse 71 69 50     1. General:  in No  Acute distress   well -appearing 2. Psychological: Alert and   Oriented 3. Head/ENT:    Dry Mucous Membranes                          Head Non traumatic, neck supple                          Poor Dentition 4. SKIN:  decreased Skin turgor,  Skin clean Dry and intact no rash    5. Heart: Regular rate and rhythm no  Murmur, no Rub or gallop 6. Lungs:  Clear to auscultation bilaterally, no wheezes or crackles   7. Abdomen: Soft,  non-tender, Non distended   obese  bowel sounds  present 8. Lower extremities: no clubbing, cyanosis, no  edema 9. Neurologically Grossly intact, moving all 4 extremities equally  10. MSK: Normal range of motion    Chart has been reviewed  ______________________________________________________________________________________________  Assessment/Plan 67 y.o. female with medical history significant of  hypothyroidism, depression   gastric lap band 2017 and Gastric bypass 2014  Admitted for colitis elevated troponin   Present on Admission:  Colitis  Iron deficiency anemia  Hypothyroidism  Chest pain  Dehydration  Hypocalcemia     Colitis Obtain Gastric panel  Bowel rest  Cont cipro/flagyl for now  Iron deficiency anemia Patient denies any bleeding. Reports she has longstanding history of anemia Colonoscopy was done in the past and was unremarkable.  As per patient Obtain anemia panel Hemoccult stool Transfuse as needed for hemoglobin below 7  Hypothyroidism - Check TSH continue home medications Synthroid at 250 mcg po q day   Chest pain Very atypical but patient has occasional episodes.  Noted slightly elevated troponin ER provider discussed case with cardiology who felt this was demand related.  Plan to continue to cycle cardiac enzymes obtain echo in the morning serial EKG.  Patient would likely benefit from outpatient further cardiac workup Troponin flat at 200  Would benefit from further cardiac  eval Given anemia and no current CP would hold off on heparin for tonight unless CP and significant jump in troponin    Dehydration Dehydration resulting in lactic acidosis and metabolic acidosis.  Will rehydrate and continue to monitor lactic acid  Hypocalcemia Replaced in the ER   Other plan as per orders.  DVT prophylaxis:  SCD    Code Status:  DNR/DNI  as per patient  I had personally discussed CODE STATUS with patient   ACP   none     Family Communication:   Family not at  Bedside    Diet clear  liquid Diet Orders (From admission, onward)     Start     Ordered   03/08/23 1425  Diet NPO time specified  Diet effective now        03/08/23 1424            Disposition Plan:       To home once workup is complete and patient is stable   Following barriers for discharge:                             Chest pain  work up is complete                            Electrolytes corrected                                                            Pain controlled with PO medications                                 able to transition to PO antibiotics                             Will need to be able to tolerate PO                                  Consult Orders  (From admission, onward)           Start     Ordered   03/08/23 1836  Consult to hospitalist  Pg by Viviann Spare  Once       Provider:  (Not yet assigned)  Question Answer Comment  Place call to: [facility]   Reason for Consult Admit      03/08/23 1835            Consults called: Cardiology is aware Sent email as well   Admission status:  ED Disposition     ED Disposition  Admit   Condition  --   Comment  The patient appears reasonably stabilized for admission considering the current resources, flow, and capabilities available in the ED at this time, and I doubt any other [facility] requiring further screening and/or treatment in the ED prior to admission is  present.          Obs       Level of care     tele  For 24H    Ginger Leeth  03/08/2023, 7:45 PM    [facility]     after 2 AM please page floor coverage PA If 7AM-7PM, please contact the day team taking care of the patient using [email or web address]

## 2023-03-08 NOTE — ED Provider Notes (Signed)
EMERGENCY DEPARTMENT AT [facility] Provider Note   CSN: [id number] Arrival date & time: 03/08/23  1419     History  No chief complaint on file.   Renee [name] is a 67 y.o. female.  HPI 67 year old female history of hyperlipidemia, hypothyroidism, depression, prior gastric bypass presenting for abdominal pain.  She woke up this morning with nonbloody diarrhea as well as severe left lower quadrant pain.  Now radiates to her periumbilical region.  She has had a few of these nonbloody bilious emesis.  No pain similar to this prior.  No history of kidney stones or diverticulitis.  She still has her appendix and gallbladder.  No prior bowel obstructions.  On the way here she developed some chest discomfort which was substernal nonradiating but resolved with burping.  No history of ACS or stroke or PE.  No fevers or chills or urinary symptoms.     Home Medications Prior to Admission medications   Medication Sig Start Date End Date Taking? Authorizing Provider  buPROPion (WELLBUTRIN XL) 150 MG 24 hr tablet TAKE 1 TABLET BY MOUTH DAILY Patient taking differently: Take 150 mg by mouth at bedtime. 02/15/19  Yes Mliss Sax, MD  Cholecalciferol (VITAMIN D3) 125 MCG (5000 UT) CAPS Take 5,000 Units by mouth every Monday, Wednesday, and Friday.   Yes [provider]  estrogen, conjugated,-medroxyprogesterone (PREMPRO) 0.45-1.5 MG tablet Take 1 tablet by mouth daily. 07/06/18  Yes Fontaine, Nadyne Coombes, MD  levothyroxine (SYNTHROID) 200 MCG tablet Take 1 tablet (200 mcg total) by mouth daily before breakfast. Patient taking differently: Take 200 mcg by mouth See admin instructions. 200 mcg every morning before breakfast except none on Tuesday. 01/27/19  Yes Mliss Sax, MD  levothyroxine (SYNTHROID) 50 MCG tablet Take 1 tablet (50 mcg total) by mouth daily. Patient taking differently: Take 50 mcg by mouth daily before breakfast. 01/27/19  Yes Mliss Sax, MD  naproxen sodium (ALEVE) 220 MG tablet Take 440 mg by mouth daily as needed (arthritis pain).   Yes [provider]  PARoxetine (PAXIL) 20 MG tablet TAKE 2 TABLETS BY MOUTH DAILY Patient taking differently: Take 40 mg by mouth at bedtime. 03/28/19  Yes Mliss Sax, MD  vitamin B-12 (CYANOCOBALAMIN) 500 MCG tablet Take 500 mcg by mouth daily.   Yes [provider]      Allergies    Erythromycin    Review of Systems   Review of Systems Review of systems completed and notable as per HPI.  ROS otherwise negative.   Physical Exam Updated Vital Signs BP (!) 141/63 (BP Location: Left Arm)   Pulse 64   Temp 98.3 F (36.8 C) (Oral)   Resp 18   Ht 5\' 2"  (1.575 m)   Wt 73.7 kg   SpO2 100%   BMI 29.72 kg/m  Physical Exam Vitals and nursing note reviewed.  Constitutional:      General: She is not in acute distress.    Appearance: She is well-developed.  HENT:     Head: Normocephalic and atraumatic.     Nose: Nose normal.     Mouth/Throat:     Mouth: Mucous membranes are moist.     Pharynx: Oropharynx is clear.  Eyes:     Extraocular Movements: Extraocular movements intact.     Conjunctiva/sclera: Conjunctivae normal.     Pupils: Pupils are equal, round, and reactive to light.  Cardiovascular:     Rate and Rhythm: Normal rate and  regular rhythm.     Heart sounds: No murmur heard. Pulmonary:     Effort: Pulmonary effort is normal. No respiratory distress.     Breath sounds: Normal breath sounds.  Abdominal:     Palpations: Abdomen is soft.     Tenderness: There is abdominal tenderness. There is no right CVA tenderness, left CVA tenderness, guarding or rebound.     Comments: LLQ TTP  Musculoskeletal:        General: No swelling.     Cervical back: Neck supple.     Right lower leg: No edema.     Left lower leg: No edema.  Skin:    General: Skin is warm and dry.     Capillary Refill: Capillary refill takes less than 2 seconds.   Neurological:     General: No focal deficit present.     Mental Status: She is alert and oriented to person, place, and time. Mental status is at baseline.  Psychiatric:        Mood and Affect: Mood normal.     ED Results / Procedures / Treatments   Labs (all labs ordered are listed, but only abnormal results are displayed) Labs Reviewed  COMPREHENSIVE METABOLIC PANEL - Abnormal; Notable for the following components:      Result Value   Chloride 112 (*)    CO2 15 (*)    Glucose, Bld 177 (*)    Calcium 7.2 (*)    Total Protein 5.5 (*)    Albumin 2.9 (*)    All other components within normal limits  CBC - Abnormal; Notable for the following components:   WBC 14.2 (*)    RBC 3.69 (*)    Hemoglobin 10.9 (*)    HCT 35.3 (*)    All other components within normal limits  URINALYSIS, ROUTINE W REFLEX MICROSCOPIC - Abnormal; Notable for the following components:   Hgb urine dipstick MODERATE (*)    Ketones, ur 5 (*)    Bacteria, UA RARE (*)    All other components within normal limits  LACTIC ACID, PLASMA - Abnormal; Notable for the following components:   Lactic Acid, Venous 2.2 (*)    All other components within normal limits  HEPATIC FUNCTION PANEL - Abnormal; Notable for the following components:   Total Protein 6.3 (*)    Albumin 3.4 (*)    Total Bilirubin 0.1 (*)    Indirect Bilirubin 0.0 (*)    All other components within normal limits  IRON AND TIBC - Abnormal; Notable for the following components:   Iron 21 (*)    TIBC 486 (*)    Saturation Ratios 4 (*)    All other components within normal limits  FERRITIN - Abnormal; Notable for the following components:   Ferritin 6 (*)    All other components within normal limits  RETICULOCYTES - Abnormal; Notable for the following components:   Immature Retic Fract 16.3 (*)    All other components within normal limits  BASIC METABOLIC PANEL - Abnormal; Notable for the following components:   Glucose, Bld 103 (*)    All  other components within normal limits  TSH - Abnormal; Notable for the following components:   TSH 0.282 (*)    All other components within normal limits  I-STAT VENOUS BLOOD GAS, ED - Abnormal; Notable for the following components:   pH, Ven 7.528 (*)    pCO2, Ven 21.5 (*)    pO2, Ven 127 (*)    Bicarbonate  17.9 (*)    TCO2 19 (*)    Acid-base deficit 3.0 (*)    Calcium, Ion 0.75 (*)    HCT 33.0 (*)    Hemoglobin 11.2 (*)    All other components within normal limits  TROPONIN I (HIGH SENSITIVITY) - Abnormal; Notable for the following components:   Troponin I (High Sensitivity) 19 (*)    All other components within normal limits  TROPONIN I (HIGH SENSITIVITY) - Abnormal; Notable for the following components:   Troponin I (High Sensitivity) 85 (*)    All other components within normal limits  TROPONIN I (HIGH SENSITIVITY) - Abnormal; Notable for the following components:   Troponin I (High Sensitivity) 204 (*)    All other components within normal limits  TROPONIN I (HIGH SENSITIVITY) - Abnormal; Notable for the following components:   Troponin I (High Sensitivity) 242 (*)    All other components within normal limits  GASTROINTESTINAL PANEL BY PCR, STOOL (REPLACES STOOL CULTURE)  C DIFFICILE QUICK SCREEN W PCR REFLEX    LIPASE, BLOOD  LACTIC ACID, PLASMA  CK  MAGNESIUM  PROCALCITONIN  PHOSPHORUS  PREALBUMIN  VITAMIN B12  FOLATE  HIV ANTIBODY (ROUTINE TESTING W REFLEX)  MAGNESIUM  PHOSPHORUS  COMPREHENSIVE METABOLIC PANEL  CBC  I-STAT CG4 LACTIC ACID, ED  I-STAT CG4 LACTIC ACID, ED    EKG EKG Interpretation Date/Time:  Monday March 08 2023 15:04:02 EDT Ventricular Rate:  66 PR Interval:  177 QRS Duration:  88 QT Interval:  417 QTC Calculation: 437 R Axis:   -15  Text Interpretation: Sinus or ectopic atrial rhythm Borderline left axis deviation Confirmed by Fulton Reek [phone]) on 03/08/2023 3:21:09 PM  Radiology CT ABDOMEN PELVIS W CONTRAST  Result Date:  03/08/2023 CLINICAL DATA:  Left lower quadrant pain. Vomiting. Diarrhea. Lower abdominal pain radiating to the left flank and back. EXAM: CT ABDOMEN AND PELVIS WITH CONTRAST TECHNIQUE: Multidetector CT imaging of the abdomen and pelvis was performed using the standard protocol following bolus administration of intravenous contrast. RADIATION DOSE REDUCTION: This exam was performed according to the departmental dose-optimization program which includes automated exposure control, adjustment of the mA and/or kV according to patient size and/or use of iterative reconstruction technique. CONTRAST:  75mL OMNIPAQUE IOHEXOL 350 MG/ML SOLN COMPARISON:  None Available. FINDINGS: Lower chest: No acute abnormality.  Heart size is normal. Hepatobiliary: Smooth liver contours. No focal liver lesion is seen. Status post cholecystectomy. The common bile duct measures up to approximately 10 mm in caliber, mildly enlarged and favored to be secondary to postsurgical reservoir effect. Trace central intrahepatic biliary ductal dilatation is also likely postsurgical from prior cholecystectomy. Pancreas: Unremarkable. No pancreatic ductal dilatation or surrounding inflammatory changes. Spleen: Normal in size without focal abnormality. Tiny normal variant splenule inferior to the spleen. Adrenals/Urinary Tract: Normal bilateral adrenals. The kidneys are symmetric in size without hydronephrosis a subtle region of mild hypoperfusion within the lateral aspect of the right renal midpole (axial series 2, image 37 and coronal series 6, image 63) may represent mild prominence of the regional calyx due to mildly thinned underlying cortex, however this hypoperfusion mildly persists into the delayed phase images (axial series 8, image 16), and may represent mild acute pyelonephritis. Recommend correlation with urinalysis. There is symmetric excretion of contrast into the proximal ureters on the delayed phase images. Within limitations of streak  artifact from total right hip arthroplasty hardware, no urinary bladder wall thickening is seen. Stomach/Bowel: There is mild wall thickening of portions of the  distal descending colon (coronal images 70 through 77 and axial series 3 images 44 through 63) separate portions of a mid sigmoid colon (coronal image 93 and axial series 3 images 66 through 70) and the a rectosigmoid junction (axial images 60 through 75 and coronal images 33 through 50). This is compatible with colitis. No high-grade diverticula are seen throughout all the regions of this wall thickening and suspected inflammatory change, however there is a small diverticulum seen at the lateral aspect of the distal descending colon that is included within the regions of colonic wall thickening (axial series 3, image 54 and coronal series 6, image 74). There is fluid within the terminal ileum but no wall thickening to suggest inflammatory change. The appendix is not confidently identified, however no inflammatory changes are seen around the cecum to indicate secondary signs of acute appendicitis. Postsurgical changes are seen of a gastric lap band which appears grossly in appropriate position. Associated left anterior abdominal wall port also appears in appropriate position. The there are surgical clips around the stomach and surgical suture near the gastroesophageal junction above the gastric band. Additional surgical suture is seen within the region of the mid duodenum, and the more distal duodenum does not appear to cross the midline and likely due to postsurgical change. No dilated loops of small bowel are seen to indicate bowel obstruction. Vascular/Lymphatic: No significant vascular findings are present. No enlarged abdominal or pelvic lymph nodes. Mild atherosclerotic calcifications. Reproductive: The uterus is present.  No gross adnexal mass is seen. Other: No ventral abdominal wall hernia. No free air or free fluid is seen within the abdomen or  pelvis. Musculoskeletal: Status post total right hip arthroplasty with associated streak artifact. Minimal dextrocurvature centered at L3. Severe left L3-4 and moderate to severe right L4-5 degenerative disc and endplate changes. IMPRESSION: 1. Mild wall thickening of portions of the distal descending colon, mid sigmoid colon, and rectosigmoid junction, compatible with colitis. There is a single small diverticulum in the region of a distal descending colon wall thickening inflammatory change. Otherwise, no significant diverticulitis. 2. Subtle region of mild low-density within the lateral aspect of the right renal midpole may represent artifact or mild prominence of the regional calyx due to mildly thinned underlying cortex. However, this low-density mildly persists into the delayed phase images possibly representing mild hypoperfusion, and may represent mild acute pyelonephritis. Recommend correlation with urinalysis. 3. Postsurgical changes are seen of a gastric lap band which appears grossly in appropriate position. No evidence of bowel obstruction. Electronically Signed   By: Neita Garnet M.D.   On: 03/08/2023 17:55   DG Chest 2 View  Result Date: 03/08/2023 CLINICAL DATA:  Chest pain. EXAM: CHEST - 2 VIEW COMPARISON:  Chest radiographs 04/20/2017 FINDINGS: Cardiac silhouette and mediastinal contours are within limits. Mildly decreased lung volumes. The lungs are clear. No pleural effusion pneumothorax. Mild multilevel degenerative disc changes of the thoracic spine. IMPRESSION: No active cardiopulmonary disease. Electronically Signed   By: Neita Garnet M.D.   On: 03/08/2023 17:22    Procedures Procedures    Medications Ordered in ED Medications  0.9 %  sodium chloride infusion (has no administration in time range)  acetaminophen (TYLENOL) tablet 650 mg (has no administration in time range)    Or  acetaminophen (TYLENOL) suppository 650 mg (has no administration in time range)   HYDROcodone-acetaminophen (NORCO/VICODIN) 5-325 MG per tablet 1-2 tablet (has no administration in time range)  ondansetron (ZOFRAN) tablet 4 mg (has no administration in time  range)    Or  ondansetron (ZOFRAN) injection 4 mg (has no administration in time range)  fentaNYL (SUBLIMAZE) injection 12.5-50 mcg (has no administration in time range)  metroNIDAZOLE (FLAGYL) IVPB 500 mg (has no administration in time range)  ciprofloxacin (CIPRO) IVPB 400 mg (has no administration in time range)  buPROPion (WELLBUTRIN XL) 24 hr tablet 150 mg (has no administration in time range)  levothyroxine (SYNTHROID) tablet 200 mcg (has no administration in time range)  levothyroxine (SYNTHROID) tablet 50 mcg (has no administration in time range)  PARoxetine (PAXIL) tablet 40 mg (has no administration in time range)  vitamin B-12 (CYANOCOBALAMIN) tablet 500 mcg (has no administration in time range)  ondansetron (ZOFRAN) injection 4 mg (4 mg Intravenous Given 03/08/23 1527)  morphine (PF) 4 MG/ML injection 4 mg (4 mg Intravenous Given 03/08/23 1528)  lactated ringers bolus 1,000 mL (0 mLs Intravenous Stopped 03/08/23 1652)  diphenhydrAMINE (BENADRYL) injection 25 mg (25 mg Intravenous Given 03/08/23 1540)  iohexol (OMNIPAQUE) 350 MG/ML injection 75 mL (75 mLs Intravenous Contrast Given 03/08/23 1637)  calcium gluconate 1 g/ 50 mL sodium chloride IVPB (0 mg Intravenous Stopped 03/08/23 1853)  ciprofloxacin (CIPRO) IVPB 400 mg (0 mg Intravenous Stopped 03/08/23 2121)  metroNIDAZOLE (FLAGYL) IVPB 500 mg (0 mg Intravenous Stopped 03/08/23 1952)    ED Course/ Medical Decision Making/ A&P Clinical Course as of 03/08/23 2321  Mon Mar 08, 2023  1536 Minor local reaction after administration of medications.  Slight skin change and itching.  No signs of anaphylaxis.  Benadryl ordered. [JD]  1835 Nishan: trend trop [JD]    Clinical Course User Index [JD] Laurence Spates, MD                                 Medical  Decision Making Amount and/or Complexity of Data Reviewed Labs: ordered. Radiology: ordered.  Risk Prescription drug management. Decision regarding hospitalization.   Medical Decision Making:   Renee [name] is a 67 y.o. female who presented to the ED today with 1 day of lower abdominal pain, vomiting, diarrhea.  Vital signs reviewed notable for mild bradycardia.  On exam she is well-appearing but tender in the left lower quadrant.  Him concern for possible diverticulitis, bowel obstruction, gastroenteritis, cholecystitis, appendicitis, pancreatitis.  She did have some chest pain which resolved after burping.  I have low suspicion for ACS, EKG is reassuring will obtain screening troponin.  Seems inconsistent with dissection, PE.  Will treat symptomatically and evaluate with CT scan.   Patient placed on continuous vitals and telemetry monitoring while in ED which was reviewed periodically.  Reviewed and confirmed nursing documentation for past medical history, family history, social history.  Initial Study Results:   Laboratory  All laboratory results reviewed.  Labs notable for elevated troponin, lactic acidosis, hypocalcemia.  EKG EKG was reviewed independently. Rate, rhythm, axis, intervals all examined and without medically relevant abnormality. ST segments without concerns for elevations.    Radiology:  All images reviewed independently.  Colitis agree with radiology report at this time.    Consults: Case discussed with cardiology.   Reassessment and Plan:   On reassessment pain is improved.  Her workup was notable for elevated troponin which is uptrending.  Discussed with cardiology, they recommend trending troponin suspect demand in setting of colitis.  Ordered antibiotics for possible infectious colitis.  Discussed with hospitalist and admitted.   Patient's presentation is most consistent with  acute complicated illness / injury requiring diagnostic workup.            Final Clinical Impression(s) / ED Diagnoses Final diagnoses:  Colitis    Rx / DC Orders ED Discharge Orders     None         Laurence Spates, MD 03/08/23 2321

## 2023-03-08 NOTE — ED Notes (Signed)
Pt to CT well appearing.

## 2023-03-08 NOTE — Assessment & Plan Note (Signed)
Dehydration resulting in lactic acidosis and metabolic acidosis.  Will rehydrate and continue to monitor lactic acid

## 2023-03-08 NOTE — Progress Notes (Signed)
Renee [name] from lab called with critical Troponin 204 collected 1927 pm on 03/08/2023.  Lana Fish, MD was notified through secure chat.  Sophonie Goforth

## 2023-03-08 NOTE — Assessment & Plan Note (Signed)
-   Check TSH continue home medications Synthroid at 250 mcg po q day

## 2023-03-08 NOTE — Assessment & Plan Note (Signed)
Very atypical but patient has occasional episodes.  Noted slightly elevated troponin ER provider discussed case with cardiology who felt this was demand related.  Plan to continue to cycle cardiac enzymes obtain echo in the morning serial EKG.  Patient would likely benefit from outpatient further cardiac workup Troponin flat at 200  Would benefit from further cardiac eval Given anemia and no current CP would hold off on heparin for tonight unless CP and significant jump in troponin

## 2023-03-08 NOTE — ED Notes (Signed)
ED TO INPATIENT HANDOFF REPORT  ED Nurse Name and Phone #: Rynn Markiewicz, RN [phone]  S Name/Age/Gender Renee [name] 67 y.o. female Room/Bed: 044C/044C  Code Status   Code Status: DNR  Home/SNF/Other Home Patient oriented to: self, place, time, and situation Is this baseline? Yes   Triage Complete: Triage complete  Chief Complaint Colitis [K52.9]  Triage Note Pt BIB [facility] from home. Pt presents with lower abd pain with radiation to L flank and back. Pt has associated N/V/D EMS states on arrival to ED pt started having CP.   EMS Vitals  152/88 HR 58 SpO2 98% RR 20   Allergies Allergies  Allergen Reactions   Erythromycin Palpitations    Level of Care/Admitting Diagnosis ED Disposition     ED Disposition  Admit   Condition  --   Comment  Hospital Area: [facility] [[id number]]  Level of Care: Telemetry Medical [104]  May place patient in observation at [facility] or [facility] if equivalent level of care is available:: No  Covid Evaluation: Asymptomatic - no recent exposure (last 10 days) testing not required  Diagnosis: Colitis [272536]  Admitting Physician: Therisa Doyne [[id number]]  Attending Physician: Therisa Doyne [[id number]]          B Medical/Surgery History Past Medical History:  Diagnosis Date   Anemia    hx   Arthritis    Depression    DM type 2 (diabetes mellitus, type 2) (HCC)    Gastric bypass status for obesity    h/o gastric bypass surgery followed by lap band   Hypercholesterolemia    Hypothyroidism    Mood swings    Osteopenia    on BMD in 07/2012, repeat in 2 yrs   Past Surgical History:  Procedure Laterality Date   bunions Bilateral 08   CESAREAN SECTION     31 yrs ago   CHOLECYSTECTOMY     COLONOSCOPY     GASTRIC BYPASS  02   HAMMER TOE SURGERY Bilateral 08   HIP FRACTURE SURGERY Right 29 yrs ago   screws   KNEE ARTHROSCOPY WITH ANTERIOR CRUCIATE LIGAMENT (ACL) REPAIR Right    28 yrs ago   LAPAROSCOPIC  GASTRIC BANDING  11   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   SHOULDER ARTHROSCOPY WITH SUBACROMIAL DECOMPRESSION AND OPEN ROTATOR C Right    28 yrs ago   TOTAL HIP ARTHROPLASTY Right 10/04/2015   Procedure: TOTAL RIGHT HIP ARTHROPLASTY;  Surgeon: Frederico Hamman, MD;  Location: [facility] OR;  Service: Orthopedics;  Laterality: Right;     A IV Location/Drains/Wounds Patient Lines/Drains/Airways Status     Active Line/Drains/Airways     Name Placement date Placement time Site Days   Peripheral IV 03/08/23 20 G Anterior;Proximal;Right Forearm 03/08/23  1536  Forearm  less than 1            Intake/Output Last 24 hours  Intake/Output Summary (Last 24 hours) at 03/08/2023 2010 Last data filed at 03/08/2023 1853 Gross per 24 hour  Intake 1100 ml  Output --  Net 1100 ml    Labs/Imaging Results for orders placed or performed during the hospital encounter of 03/08/23 (from the past 48 hour(s))  Lipase, blood     Status: None   Collection Time: 03/08/23  2:57 PM  Result Value Ref Range   Lipase 20 11 - 51 U/L    Comment: Performed at [facility], [address] [place], [place] [address]  Comprehensive metabolic panel  Status: Abnormal   Collection Time: 03/08/23  2:57 PM  Result Value Ref Range   Sodium 140 135 - 145 mmol/L   Potassium 3.7 3.5 - 5.1 mmol/L    Comment: HEMOLYSIS AT THIS LEVEL MAY AFFECT RESULT   Chloride 112 (H) 98 - 111 mmol/L   CO2 15 (L) 22 - 32 mmol/L   Glucose, Bld 177 (H) 70 - 99 mg/dL    Comment: Glucose reference range applies only to samples taken after fasting for at least 8 hours.   BUN 11 8 - 23 mg/dL   Creatinine, Ser 4.01 0.44 - 1.00 mg/dL   Calcium 7.2 (L) 8.9 - 10.3 mg/dL   Total Protein 5.5 (L) 6.5 - 8.1 g/dL   Albumin 2.9 (L) 3.5 - 5.0 g/dL   AST 28 15 - 41 U/L    Comment: HEMOLYSIS AT THIS LEVEL MAY AFFECT RESULT   ALT 15 0 - 44 U/L    Comment: HEMOLYSIS AT THIS LEVEL MAY AFFECT RESULT   Alkaline Phosphatase 57 38 - 126 U/L   Total  Bilirubin 0.8 0.3 - 1.2 mg/dL    Comment: HEMOLYSIS AT THIS LEVEL MAY AFFECT RESULT   GFR, Estimated >60 >60 mL/min    Comment: (NOTE) Calculated using the CKD-EPI Creatinine Equation (2021)    Anion gap 13 5 - 15    Comment: Performed at [facility], [address] [place], [place] [address]  CBC     Status: Abnormal   Collection Time: 03/08/23  2:57 PM  Result Value Ref Range   WBC 14.2 (H) 4.0 - 10.5 K/uL   RBC 3.69 (L) 3.87 - 5.11 MIL/uL   Hemoglobin 10.9 (L) 12.0 - 15.0 g/dL   HCT 36.6 (L) 44.0 - 34.7 %   MCV 95.7 80.0 - 100.0 fL   MCH 29.5 26.0 - 34.0 pg   MCHC 30.9 30.0 - 36.0 g/dL   RDW 42.5 95.6 - 38.7 %   Platelets 183 150 - 400 K/uL   nRBC 0.0 0.0 - 0.2 %    Comment: Performed at [facility], [address] [place], [place] [address]  Troponin I (High Sensitivity)     Status: Abnormal   Collection Time: 03/08/23  2:57 PM  Result Value Ref Range   Troponin I (High Sensitivity) 19 (H) <18 ng/L    Comment: (NOTE) Elevated high sensitivity troponin I (hsTnI) values and significant  changes across serial measurements may suggest ACS but many other  chronic and acute conditions are known to elevate hsTnI results.  Refer to the "Links" section for chest pain algorithms and additional  guidance. Performed at [facility], [address], [place], [place] [address]   Lactic acid, plasma     Status: Abnormal   Collection Time: 03/08/23  5:03 PM  Result Value Ref Range   Lactic Acid, Venous 2.2 (HH) 0.5 - 1.9 mmol/L    Comment: CRITICAL RESULT CALLED TO, READ BACK BY AND VERIFIED WITH C SCHILLING RN AT [phone] BY D LONG Performed at [facility], [address] [place], [place] [address]   Troponin I (High Sensitivity)     Status: Abnormal   Collection Time: 03/08/23  5:03 PM  Result Value Ref Range   Troponin I (High Sensitivity) 85 (H) <18 ng/L    Comment: RESULT CALLED TO, READ BACK BY AND VERIFIED WITH C [facility] RN AT [phone] BY D  LONG (NOTE) Elevated high sensitivity troponin  I (hsTnI) values and significant  changes across serial measurements may suggest ACS but many other  chronic and acute conditions are known to elevate hsTnI results.  Refer to the "Links" section for chest pain algorithms and additional  guidance. Performed at [facility], [address], [place], [place] [address]   Urinalysis, Routine w reflex microscopic -Urine, Clean Catch     Status: Abnormal   Collection Time: 03/08/23  5:19 PM  Result Value Ref Range   Color, Urine YELLOW YELLOW   APPearance CLEAR CLEAR   Specific Gravity, Urine 1.020 1.005 - 1.030   pH 5.0 5.0 - 8.0   Glucose, UA NEGATIVE NEGATIVE mg/dL   Hgb urine dipstick MODERATE (A) NEGATIVE   Bilirubin Urine NEGATIVE NEGATIVE   Ketones, ur 5 (A) NEGATIVE mg/dL   Protein, ur NEGATIVE NEGATIVE mg/dL   Nitrite NEGATIVE NEGATIVE   Leukocytes,Ua NEGATIVE NEGATIVE   RBC / HPF 6-10 0 - 5 RBC/hpf   WBC, UA 0-5 0 - 5 WBC/hpf   Bacteria, UA RARE (A) NONE SEEN   Squamous Epithelial / HPF 0-5 0 - 5 /HPF   Mucus PRESENT     Comment: Performed at [facility], [address] [place], [place] [address]  I-Stat venous blood gas, [facility], [facility], [facility])     Status: Abnormal   Collection Time: 03/08/23  5:29 PM  Result Value Ref Range   pH, Ven 7.528 (H) 7.25 - 7.43   pCO2, Ven 21.5 (L) 44 - 60 mmHg   pO2, Ven 127 (H) 32 - 45 mmHg   Bicarbonate 17.9 (L) 20.0 - 28.0 mmol/L   TCO2 19 (L) 22 - 32 mmol/L   O2 Saturation 99 %   Acid-base deficit 3.0 (H) 0.0 - 2.0 mmol/L   Sodium 137 135 - 145 mmol/L   Potassium 4.7 3.5 - 5.1 mmol/L   Calcium, Ion 0.75 (LL) 1.15 - 1.40 mmol/L   HCT 33.0 (L) 36.0 - 46.0 %   Hemoglobin 11.2 (L) 12.0 - 15.0 g/dL   Sample type VENOUS    Comment NOTIFIED PHYSICIAN    CT ABDOMEN PELVIS W CONTRAST  Result Date: 03/08/2023 CLINICAL DATA:  Left lower quadrant pain. Vomiting. Diarrhea. Lower abdominal pain radiating to the left flank and back. EXAM:  CT ABDOMEN AND PELVIS WITH CONTRAST TECHNIQUE: Multidetector CT imaging of the abdomen and pelvis was performed using the standard protocol following bolus administration of intravenous contrast. RADIATION DOSE REDUCTION: This exam was performed according to the departmental dose-optimization program which includes automated exposure control, adjustment of the mA and/or kV according to patient size and/or use of iterative reconstruction technique. CONTRAST:  75mL OMNIPAQUE IOHEXOL 350 MG/ML SOLN COMPARISON:  None Available. FINDINGS: Lower chest: No acute abnormality.  Heart size is normal. Hepatobiliary: Smooth liver contours. No focal liver lesion is seen. Status post cholecystectomy. The common bile duct measures up to approximately 10 mm in caliber, mildly enlarged and favored to be secondary to postsurgical reservoir effect. Trace central intrahepatic biliary ductal dilatation is also likely postsurgical from prior cholecystectomy. Pancreas: Unremarkable. No pancreatic ductal dilatation or surrounding inflammatory changes. Spleen: Normal in size without focal abnormality. Tiny normal variant splenule inferior to the spleen. Adrenals/Urinary Tract: Normal bilateral adrenals. The kidneys are symmetric in size without hydronephrosis a subtle region of mild hypoperfusion within the lateral aspect of the right renal midpole (axial series 2, image 37 and coronal series 6, image 63) may represent mild prominence of the regional calyx  due to mildly thinned underlying cortex, however this hypoperfusion mildly persists into the delayed phase images (axial series 8, image 16), and may represent mild acute pyelonephritis. Recommend correlation with urinalysis. There is symmetric excretion of contrast into the proximal ureters on the delayed phase images. Within limitations of streak artifact from total right hip arthroplasty hardware, no urinary bladder wall thickening is seen. Stomach/Bowel: There is mild wall thickening  of portions of the distal descending colon (coronal images 70 through 77 and axial series 3 images 44 through 63) separate portions of a mid sigmoid colon (coronal image 93 and axial series 3 images 66 through 70) and the a rectosigmoid junction (axial images 60 through 75 and coronal images 33 through 50). This is compatible with colitis. No high-grade diverticula are seen throughout all the regions of this wall thickening and suspected inflammatory change, however there is a small diverticulum seen at the lateral aspect of the distal descending colon that is included within the regions of colonic wall thickening (axial series 3, image 54 and coronal series 6, image 74). There is fluid within the terminal ileum but no wall thickening to suggest inflammatory change. The appendix is not confidently identified, however no inflammatory changes are seen around the cecum to indicate secondary signs of acute appendicitis. Postsurgical changes are seen of a gastric lap band which appears grossly in appropriate position. Associated left anterior abdominal wall port also appears in appropriate position. The there are surgical clips around the stomach and surgical suture near the gastroesophageal junction above the gastric band. Additional surgical suture is seen within the region of the mid duodenum, and the more distal duodenum does not appear to cross the midline and likely due to postsurgical change. No dilated loops of small bowel are seen to indicate bowel obstruction. Vascular/Lymphatic: No significant vascular findings are present. No enlarged abdominal or pelvic lymph nodes. Mild atherosclerotic calcifications. Reproductive: The uterus is present.  No gross adnexal mass is seen. Other: No ventral abdominal wall hernia. No free air or free fluid is seen within the abdomen or pelvis. Musculoskeletal: Status post total right hip arthroplasty with associated streak artifact. Minimal dextrocurvature centered at L3. Severe  left L3-4 and moderate to severe right L4-5 degenerative disc and endplate changes. IMPRESSION: 1. Mild wall thickening of portions of the distal descending colon, mid sigmoid colon, and rectosigmoid junction, compatible with colitis. There is a single small diverticulum in the region of a distal descending colon wall thickening inflammatory change. Otherwise, no significant diverticulitis. 2. Subtle region of mild low-density within the lateral aspect of the right renal midpole may represent artifact or mild prominence of the regional calyx due to mildly thinned underlying cortex. However, this low-density mildly persists into the delayed phase images possibly representing mild hypoperfusion, and may represent mild acute pyelonephritis. Recommend correlation with urinalysis. 3. Postsurgical changes are seen of a gastric lap band which appears grossly in appropriate position. No evidence of bowel obstruction. Electronically Signed   By: Neita Garnet M.D.   On: 03/08/2023 17:55   DG Chest 2 View  Result Date: 03/08/2023 CLINICAL DATA:  Chest pain. EXAM: CHEST - 2 VIEW COMPARISON:  Chest radiographs 04/20/2017 FINDINGS: Cardiac silhouette and mediastinal contours are within limits. Mildly decreased lung volumes. The lungs are clear. No pleural effusion pneumothorax. Mild multilevel degenerative disc changes of the thoracic spine. IMPRESSION: No active cardiopulmonary disease. Electronically Signed   By: Neita Garnet M.D.   On: 03/08/2023 17:22    Pending Labs Unresulted  Labs (From admission, onward)     Start     Ordered   03/09/23 0500  Prealbumin  Tomorrow morning,   R        03/08/23 1926   03/09/23 0500  Vitamin B12  (Anemia Panel (PNL))  Tomorrow morning,   R        03/08/23 1946   03/09/23 0500  Folate  (Anemia Panel (PNL))  Tomorrow morning,   R        03/08/23 1946   03/08/23 1952  Basic metabolic panel  Once,   R        03/08/23 1951   03/08/23 1947  Iron and TIBC  (Anemia Panel (PNL))   Add-on,   AD        03/08/23 1946   03/08/23 1947  Ferritin  (Anemia Panel (PNL))  Add-on,   AD        03/08/23 1946   03/08/23 1947  Reticulocytes  (Anemia Panel (PNL))  Add-on,   AD        03/08/23 1946   03/08/23 1927  CK  Add-on,   AD        03/08/23 1926   03/08/23 1927  Magnesium  Add-on,   AD        03/08/23 1926   03/08/23 1927  Procalcitonin  Add-on,   AD       References:    Procalcitonin Lower Respiratory Tract Infection AND Sepsis Procalcitonin Algorithm   03/08/23 1926   03/08/23 1927  Hepatic function panel  Add-on,   AD       Question:  Release to patient  Answer:  Immediate   03/08/23 1926   03/08/23 1927  Phosphorus  Add-on,   AD        03/08/23 1926   03/08/23 1927  TSH  Add-on,   AD        03/08/23 1926   03/08/23 1927  Gastrointestinal Panel by PCR , Stool  (Gastrointestinal Panel by PCR, Stool                                                                                                                                                     **Does Not include CLOSTRIDIUM DIFFICILE testing. **If CDIFF testing is needed, place order from the "C Difficile Testing" order set.**)  Once,   URGENT        03/08/23 1926   03/08/23 1927  C Difficile Quick Screen w PCR reflex  (C Difficile quick screen w PCR reflex panel )  Once, for 24 hours,   URGENT       References:    CDiff Information Tool   03/08/23 1926   03/08/23 1622  Lactic acid, plasma  (Lactic Acid)  Now then every 2 hours,   R (with STAT occurrences)  03/08/23 1621   Unscheduled  Occult blood card to lab, stool  As needed,   R      03/08/23 1946   Signed and Held  HIV Antibody (routine testing w rflx)  (HIV Antibody (Routine testing w reflex) panel)  Once,   R        Signed and Held   Signed and Held  Magnesium  Tomorrow morning,   R        Signed and Held   Signed and Held  Phosphorus  Tomorrow morning,   R        Signed and Held   Signed and Held  Comprehensive metabolic panel  Tomorrow morning,   R        Question:  Release to patient  Answer:  Immediate   Signed and Held   Signed and Held  CBC  Tomorrow morning,   R       Question:  Release to patient  Answer:  Immediate   Signed and Held            Vitals/Pain Today's Vitals   03/08/23 1600 03/08/23 1719 03/08/23 1945 03/08/23 1959  BP: (!) 145/73  139/77   Pulse: 71  71   Resp: 18  16   Temp:    98.5 F (36.9 C)  TempSrc:    Oral  SpO2: 100%  98%   Weight:      Height:      PainSc:  1       Isolation Precautions Enteric precautions (UV disinfection)  Medications Medications  ciprofloxacin (CIPRO) IVPB 400 mg (has no administration in time range)  metroNIDAZOLE (FLAGYL) IVPB 500 mg (has no administration in time range)  ciprofloxacin (CIPRO) IVPB 400 mg (has no administration in time range)  ondansetron (ZOFRAN) injection 4 mg (4 mg Intravenous Given 03/08/23 1527)  morphine (PF) 4 MG/ML injection 4 mg (4 mg Intravenous Given 03/08/23 1528)  lactated ringers bolus 1,000 mL (0 mLs Intravenous Stopped 03/08/23 1652)  diphenhydrAMINE (BENADRYL) injection 25 mg (25 mg Intravenous Given 03/08/23 1540)  iohexol (OMNIPAQUE) 350 MG/ML injection 75 mL (75 mLs Intravenous Contrast Given 03/08/23 1637)  calcium gluconate 1 g/ 50 mL sodium chloride IVPB (0 mg Intravenous Stopped 03/08/23 1853)  metroNIDAZOLE (FLAGYL) IVPB 500 mg (0 mg Intravenous Stopped 03/08/23 1952)    Mobility walks     Focused Assessments    R Recommendations: See Admitting Provider Note  Report given to:   Additional Notes: Patient ambulates and swallows pills withiout problems. Patient currently on liquid diet, eating ice at the moment. No complaints at this time.

## 2023-03-08 NOTE — Subjective & Objective (Signed)
Hx of abd pain and diarrhea had associated CP Non bloody dairrhea severe LLLQ pain CP was substernal non radaiting No fever no chills

## 2023-03-08 NOTE — Progress Notes (Signed)
Patient arrived via stretcher to the room with her cell phone and personal clothing at bedside. Patient is alert and oriented x4 and ambulated to the bed. Patient reported L chest pain 1/10 that was more like a "twinge, kind of like a cat scratch" that eased up with rest. VS stable.  Patient was placed on tele monitor, tele unit was called and verified. Patient was oriented to the room, call bell and staffs. Bed alarm initiated and call bell within reach.  Baltasar Twilley

## 2023-03-08 NOTE — ED Triage Notes (Signed)
Pt BIB [facility] from home. Pt presents with lower abd pain with radiation to L flank and back. Pt has associated N/V/D EMS states on arrival to ED pt started having CP.   EMS Vitals  152/88 HR 58 SpO2 98% RR 20

## 2023-03-08 NOTE — Assessment & Plan Note (Signed)
Obtain Gastric panel  Bowel rest  Cont cipro/flagyl for now

## 2023-03-08 NOTE — Assessment & Plan Note (Addendum)
Patient denies any bleeding. Reports she has longstanding history of anemia Colonoscopy was done in the past and was unremarkable.  As per patient Obtain anemia panel Hemoccult stool Transfuse as needed for hemoglobin below 7

## 2023-03-09 ENCOUNTER — Observation Stay (HOSPITAL_BASED_OUTPATIENT_CLINIC_OR_DEPARTMENT_OTHER): Payer: Medicare Other

## 2023-03-09 DIAGNOSIS — D508 Other iron deficiency anemias: Secondary | ICD-10-CM | POA: Diagnosis not present

## 2023-03-09 DIAGNOSIS — R079 Chest pain, unspecified: Secondary | ICD-10-CM | POA: Diagnosis not present

## 2023-03-09 DIAGNOSIS — K529 Noninfective gastroenteritis and colitis, unspecified: Secondary | ICD-10-CM | POA: Diagnosis not present

## 2023-03-09 DIAGNOSIS — E039 Hypothyroidism, unspecified: Secondary | ICD-10-CM | POA: Diagnosis not present

## 2023-03-09 LAB — TROPONIN I (HIGH SENSITIVITY)
Troponin I (High Sensitivity): 88 ng/L — ABNORMAL HIGH (ref <18)
Troponin I (High Sensitivity): 70 ng/L — ABNORMAL HIGH (ref <18)

## 2023-03-09 LAB — CBC
HCT: 33.5 % — ABNORMAL LOW (ref 36.0–46.0)
Hemoglobin: 10.8 g/dL — ABNORMAL LOW (ref 12.0–15.0)
MCH: 30.5 pg (ref 26.0–34.0)
MCHC: 32.2 g/dL (ref 30.0–36.0)
MCV: 94.6 fL (ref 80.0–100.0)
Platelets: 291 10*3/uL (ref 150–400)
RBC: 3.54 MIL/uL — ABNORMAL LOW (ref 3.87–5.11)
RDW: 13 % (ref 11.5–15.5)
WBC: 9.6 10*3/uL (ref 4.0–10.5)
nRBC: 0 % (ref 0.0–0.2)

## 2023-03-09 LAB — COMPREHENSIVE METABOLIC PANEL
ALT: 18 U/L (ref 0–44)
AST: 20 U/L (ref 15–41)
Albumin: 2.9 g/dL — ABNORMAL LOW (ref 3.5–5.0)
Alkaline Phosphatase: 57 U/L (ref 38–126)
Anion gap: 8 (ref 5–15)
BUN: 7 mg/dL — ABNORMAL LOW (ref 8–23)
CO2: 24 mmol/L (ref 22–32)
Calcium: 8.2 mg/dL — ABNORMAL LOW (ref 8.9–10.3)
Chloride: 105 mmol/L (ref 98–111)
Creatinine, Ser: 0.86 mg/dL (ref 0.44–1.00)
GFR, Estimated: >60 mL/min (ref >60)
Glucose, Bld: 99 mg/dL (ref 70–99)
Potassium: 3.7 mmol/L (ref 3.5–5.1)
Sodium: 137 mmol/L (ref 135–145)
Total Bilirubin: 0.6 mg/dL (ref 0.3–1.2)
Total Protein: 5.5 g/dL — ABNORMAL LOW (ref 6.5–8.1)

## 2023-03-09 LAB — ECHOCARDIOGRAM COMPLETE
Area-P 1/2: 4.8 cm2
Height: 62 in
S' Lateral: 2.3 cm
Weight: 2592.61 [oz_av]

## 2023-03-09 LAB — C DIFFICILE QUICK SCREEN W PCR REFLEX
C Diff antigen: NEGATIVE
C Diff interpretation: NOT DETECTED
C Diff toxin: NEGATIVE

## 2023-03-09 LAB — PHOSPHORUS: Phosphorus: 3.3 mg/dL (ref 2.5–4.6)

## 2023-03-09 LAB — T4, FREE: Free T4: 1.38 ng/dL — ABNORMAL HIGH (ref 0.61–1.12)

## 2023-03-09 LAB — FOLATE: Folate: 22.7 ng/mL (ref >5.9)

## 2023-03-09 LAB — HIV ANTIBODY (ROUTINE TESTING W REFLEX): HIV Screen 4th Generation wRfx: NONREACTIVE

## 2023-03-09 LAB — MAGNESIUM: Magnesium: 1.7 mg/dL (ref 1.7–2.4)

## 2023-03-09 LAB — PREALBUMIN: Prealbumin: 15 mg/dL — ABNORMAL LOW (ref 18–38)

## 2023-03-09 LAB — VITAMIN B12: Vitamin B-12: 541 pg/mL (ref 180–914)

## 2023-03-09 MED ORDER — FE FUM-VIT C-VIT B12-FA 460-60-0.01-1 MG PO CAPS
1.0000 | ORAL_CAPSULE | Freq: Every day | ORAL | Status: DC
  Administered 2023-03-09 – 2023-03-10 (×2): 1 via ORAL
  Filled 2023-03-09 (×2): qty 1

## 2023-03-09 MED ORDER — METOPROLOL SUCCINATE ER 50 MG PO TB24: 50.0000 mg | ORAL_TABLET | Freq: Once | ORAL | Status: DC

## 2023-03-09 MED ORDER — METOPROLOL TARTRATE 50 MG PO TABS
100.0000 mg | ORAL_TABLET | Freq: Once | ORAL | Status: AC
  Administered 2023-03-10: 100 mg via ORAL
  Filled 2023-03-09: qty 2

## 2023-03-09 MED ORDER — CALCIUM CARBONATE 1250 (500 CA) MG PO TABS
1.0000 | ORAL_TABLET | Freq: Two times a day (BID) | ORAL | Status: DC
  Administered 2023-03-09 – 2023-03-10 (×2): 1250 mg via ORAL
  Filled 2023-03-09 (×2): qty 1

## 2023-03-09 NOTE — Assessment & Plan Note (Signed)
Anemia panel concerning for iron deficiency.  Colonoscopy done in the past was unremarkable. -Start her on iron supplement -Outpatient follow-up

## 2023-03-09 NOTE — Assessment & Plan Note (Signed)
Received IV calcium in ED, corrected calcium 9.1 today -Start her on p.o. supplement

## 2023-03-09 NOTE — Plan of Care (Signed)

## 2023-03-09 NOTE — Progress Notes (Signed)
SATURATION QUALIFICATIONS: (This note is used to comply with regulatory documentation for home oxygen)  Patient Saturations on Room Air at Rest = 97%  Patient Saturations on Room Air while Ambulating = 97%  Patient Saturations on 0 Liters of oxygen while Ambulating = 97%  Please briefly explain why patient needs home oxygen: 

## 2023-03-09 NOTE — Hospital Course (Addendum)
Taken from H&P.  Renee [name] is a 67 y.o. female with medical history significant of  hypothyroidism, depression, gastric lap band 2017 and Gastric bypass 2014 presented with diarrhea and abdominal pain.  Some intermittent substernal chest pain, nonradiating.  No fever or chills.  Describes chest pain as sharp lightening bolt strikes for the past 1 month, worse when laying down at night.  CT abdomen and pelvis for concern of colitis involving distal descending, mid sigmoid and rectosigmoid junction.  Started on Cipro and Flagyl.  8/20: Vital stable, leukocytosis resolved, corrected calcium at 9.1 after getting IV replacement in ED. troponin peaked at 242 and trending down. Anemia panel consistent with iron deficiency, started on iron and calcium supplement. Pending echocardiogram, cardiology will do a CT coronary tomorrow.

## 2023-03-09 NOTE — Assessment & Plan Note (Signed)
CT imaging concerning for colitis.  Diarrhea has been resolved, GI pathogen panel and C. difficile was ordered but patient did not had any BM while in the hospital. -Discontinue precautions -Continue with IV Cipro and Flagyl-can be converted to p.o. on discharge

## 2023-03-09 NOTE — Consult Note (Signed)
Cardiology Consultation   Patient ID: Renee [name] MRN: [id number]; DOB: [birth year]  Admit date: 03/08/2023 Date of Consult: 03/09/2023  PCP:  Patient, No Pcp Per   [facility] Providers Cardiologist:  None        Patient Profile:   Renee [name] is a 67 y.o. 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  History of Present Illness:   Renee [name] with hx as above and no prior cardiac hx presented to ER with abd pain and chest pain on occ. She also had syncope yesterday.  Her pain has been sharp chest pain. But not just on ER arrival.  For last month episodic chest discomfort some sharp and tingling brief and also a squeezing discomfort mid sternal that lasts about 1 min.  Comes at rest and with activity but if she is active she is able to continue does not need to stop.  Her father and mother with heart attacks in their 43s they both smoked and she does not.    She had HTN, DM2 and HLD until she lost wt with gastric procedures.  She has been more fatigued than usual over last few months has to stop and rest when before she did not have to do this.   Here she was placed on ABX  with elevated WBC and lactic acid.  Feeling better today  2V CXR NAD CT abd compatible with colitis. Single small diverticulum in region of a distal descending colon wall thickening inflammatory change no bowel obstruction.  Na 140 K+ 3.7 cl 112 CO2 15 BUN 11 Cr 0.89 albumin 2.9  Hgb 10.9 WBC 14.2 plts 183   lactic acid 2.2>>1.6   wbc now 9.6  Hs troponin 19>> 85>>204>>242>>88 CK 78 Iron 21 ferritin 6 folate 22  EKG:  The EKG was personally reviewed and demonstrates:  SB at 87 and no acute ST changes similar to EKG in 2016  Telemetry:  Telemetry was personally reviewed and demonstrates:  SR  With  ambulation sp02 97% on RA  BP 130/65 P 83 R 73   Past Medical History:  Diagnosis Date   Anemia    hx   Arthritis    Depression    DM type 2 (diabetes mellitus, type 2) (HCC)    Gastric bypass status for obesity    h/o gastric bypass surgery followed by lap band   Hypercholesterolemia    Hypothyroidism    Mood swings    Osteopenia    on BMD in 07/2012, repeat in 2 yrs    Past Surgical History:  Procedure Laterality Date   bunions Bilateral 08   CESAREAN SECTION     31 yrs ago   CHOLECYSTECTOMY     COLONOSCOPY     GASTRIC BYPASS  02   HAMMER TOE SURGERY Bilateral 08   HIP FRACTURE SURGERY Right 29 yrs ago   screws   KNEE ARTHROSCOPY WITH ANTERIOR CRUCIATE LIGAMENT (ACL) REPAIR Right    28 yrs ago   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 Authorizing Provider  buPROPion (WELLBUTRIN XL) 150 MG 24 hr tablet TAKE 1 TABLET BY MOUTH DAILY Patient taking differently: Take 150 mg by mouth at bedtime. 02/15/19  Yes Mliss Sax, MD  Cholecalciferol (VITAMIN D3) 125 MCG (5000 UT) CAPS Take 5,000 Units by mouth every Monday, Wednesday, and Friday.   Yes [provider]  estrogen, conjugated,-medroxyprogesterone (PREMPRO) 0.45-1.5 MG tablet Take 1 tablet by mouth daily. 07/06/18  Yes Fontaine, Nadyne Coombes, MD  levothyroxine (SYNTHROID) 200 MCG tablet Take 1 tablet (200 mcg total) by mouth daily before breakfast. Patient taking differently: Take 200 mcg by mouth See admin instructions. 200 mcg every morning before breakfast except none on Tuesday. 01/27/19  Yes Mliss Sax, MD  levothyroxine (SYNTHROID) 50 MCG tablet Take 1 tablet (50 mcg total) by mouth daily. Patient taking differently: Take 50 mcg by mouth daily before breakfast. 01/27/19  Yes Mliss Sax, MD  naproxen sodium (ALEVE) 220 MG tablet Take 440 mg by mouth daily as needed (arthritis pain).   Yes [provider]  PARoxetine (PAXIL) 20 MG tablet TAKE 2 TABLETS BY MOUTH DAILY Patient taking differently: Take 40 mg by mouth at bedtime. 03/28/19  Yes Mliss Sax, MD  vitamin B-12 (CYANOCOBALAMIN) 500 MCG tablet Take 500 mcg by mouth daily.   Yes [provider]    Inpatient Medications: Scheduled Meds:  buPROPion  150 mg Oral QHS   calcium carbonate  1 tablet Oral BID WC   Fe Fum-Vit C-Vit B12-FA  1 capsule Oral QPC breakfast   [START ON 03/10/2023] levothyroxine  200 mcg Oral Once per day on Sunday Monday Wednesday Thursday Friday Saturday   levothyroxine  50 mcg Oral QAC breakfast   PARoxetine  40 mg Oral QHS   Continuous Infusions:  ciprofloxacin Stopped (03/09/23 7341)   metronidazole Stopped (03/09/23 0725)   PRN Meds: acetaminophen **OR** acetaminophen, fentaNYL (SUBLIMAZE) injection, HYDROcodone-acetaminophen, ondansetron **OR** ondansetron (ZOFRAN) IV  Allergies:    Allergies  Allergen Reactions   Erythromycin Palpitations    Social History:   Social History   Socioeconomic History   Marital status: Married    Spouse name: Not on file   Number of children: Not on file   Years of education: Not on file   Highest education level: Not on file  Occupational History   Not on file  Tobacco Use   Smoking status: Never   Smokeless tobacco: Never  Vaping Use   Vaping status: Never Used  Substance and Sexual Activity   Alcohol use: No   Drug use: No   Sexual activity: Yes    Comment: 1st intercourse 67 yo-Fewer than 5 partners  Other Topics Concern   Not on file  Social History Narrative   Not on file   Social Determinants of  Health   Financial Resource Strain: Not on file  Food Insecurity: No Food Insecurity (03/08/2023)   Hunger Vital Sign    Worried About Running Out of Food in the Last Year: Never true    Ran Out of Food in the Last Year: Never true  Transportation Needs: No Transportation Needs (03/08/2023)   PRAPARE - Administrator, Civil Service (Medical): No    Lack of Transportation (Non-Medical): No  Physical Activity: Not on file  Stress: Not on file  Social Connections: Not on file  Intimate Partner Violence: Not At Risk (03/08/2023)   Humiliation, Afraid, Rape, and Kick questionnaire    Fear of Current or Ex-Partner: No    Emotionally Abused: No    Physically Abused: No    Sexually Abused: No    Family History:    Family History  Problem Relation Age of Onset   Depression Mother    Drug abuse Mother    Early death Mother    Heart attack Mother    Hyperlipidemia Mother    Hypertension Mother    Mental illness Mother    Heart attack Father    Alcohol abuse Father    Arthritis Father    Heart disease Father    Hyperlipidemia Father    Hypertension Father    Alcohol abuse Sister    Diabetes Sister    Drug abuse Sister    Hyperlipidemia Sister    Hypertension Sister    Arthritis Daughter    Diabetes Daughter    Hyperlipidemia Daughter    Hypertension Daughter    Miscarriages / [place] Daughter    Arthritis Maternal Grandmother    Hearing loss Maternal Grandmother    Heart disease Maternal Grandmother    Hypertension Maternal Grandmother    Mental illness Sister      ROS:  Please see the history of present illness.  General:no colds or fevers, no weight changes Skin:no rashes or ulcers HEENT:no blurred vision, no congestion CV:see HPI PUL:see HPI GI:no diarrhea constipation or melena, no indigestion GU:no hematuria, no dysuria MS:no joint pain, no claudication Neuro:+ syncope, no lightheadedness Endo:+ diabetes, no thyroid disease  All other ROS  reviewed and negative.     Physical Exam/Data:   Vitals:   03/09/23 0012 03/09/23 0435 03/09/23 0549 03/09/23 0811  BP: 125/60 125/68  130/65  Pulse: 66 73  73  Resp: 18 18    Temp: 98.3 F (36.8 C) 98.1 F (36.7 C)    TempSrc: Oral Oral    SpO2: 97% 97%  97%  Weight:   73.5 kg   Height:        Intake/Output Summary (Last 24 hours) at 03/09/2023 1106 Last data filed at 03/09/2023 0727 Gross per 24 hour  Intake 2315.45 ml  Output 600 ml  Net 1715.45 ml      03/09/2023    5:49 AM 03/08/2023    8:57 PM 03/08/2023    2:22 PM  Last 3 Weights  Weight (lbs) 162 lb 0.6 oz 162 lb 7.7 oz 160 lb  Weight (kg) 73.5 kg 73.7 kg 72.576 kg     Body mass index is 29.64 kg/m.  General:  Well nourished, well developed, in no acute distress HEENT: normal Neck: no JVD Vascular: No carotid bruits; Distal pulses 2+ bilaterally Cardiac:  normal S1, S2; RRR; no murmur gallup rub or click Lungs:  clear to auscultation bilaterally, no wheezing, rhonchi or rales  Abd: soft, nontender, no hepatomegaly  Ext: no edema Musculoskeletal:  No deformities, BUE and BLE strength normal and equal Skin: warm and dry  Neuro:  alert and oriented X 3 MAE follows commands no focal abnormalities noted Psych:  Normal affect    Relevant CV Studies: Echo pending   Laboratory Data:  High Sensitivity Troponin:   Recent Labs  Lab 03/08/23 1457 03/08/23 1703 03/08/23 2002 03/08/23 2132 03/09/23 0902  TROPONINIHS 19* 85* 204* 242* 88*     Chemistry Recent Labs  Lab 03/08/23 1457 03/08/23 1729 03/08/23 2002  03/09/23 0346  NA 140 137 141 137  K 3.7 4.7 4.1 3.7  CL 112*  --  104 105  CO2 15*  --  23 24  GLUCOSE 177*  --  103* 99  BUN 11  --  8 7*  CREATININE 0.89  --  0.86 0.86  CALCIUM 7.2*  --  9.1 8.2*  MG  --   --  1.7 1.7  GFRNONAA >60  --  >60 >60  ANIONGAP 13  --  14 8    Recent Labs  Lab 03/08/23 1457 03/08/23 2002 03/09/23 0346  PROT 5.5* 6.3* 5.5*  ALBUMIN 2.9* 3.4* 2.9*  AST  28 23 20   ALT 15 21 18   ALKPHOS 57 66 57  BILITOT 0.8 0.1* 0.6   Lipids No results for input(s): "CHOL", "TRIG", "HDL", "LABVLDL", "LDLCALC", "CHOLHDL" in the last 168 hours.  Hematology Recent Labs  Lab 03/08/23 1457 03/08/23 1729 03/08/23 2002 03/09/23 0346  WBC 14.2*  --   --  9.6  RBC 3.69*  --  4.06 3.54*  HGB 10.9* 11.2*  --  10.8*  HCT 35.3* 33.0*  --  33.5*  MCV 95.7  --   --  94.6  MCH 29.5  --   --  30.5  MCHC 30.9  --   --  32.2  RDW 12.9  --   --  13.0  PLT 183  --   --  291   Thyroid  Recent Labs  Lab 03/08/23 2002 03/09/23 0346  TSH 0.282*  --   FREET4  --  1.38*    BNPNo results for input(s): "BNP", "PROBNP" in the last 168 hours.  DDimer No results for input(s): "DDIMER" in the last 168 hours.   Radiology/Studies:  CT ABDOMEN PELVIS W CONTRAST  Result Date: 03/08/2023 CLINICAL DATA:  Left lower quadrant pain. Vomiting. Diarrhea. Lower abdominal pain radiating to the left flank and back. EXAM: CT ABDOMEN AND PELVIS WITH CONTRAST TECHNIQUE: Multidetector CT imaging of the abdomen and pelvis was performed using the standard protocol following bolus administration of intravenous contrast. RADIATION DOSE REDUCTION: This exam was performed according to the departmental dose-optimization program which includes automated exposure control, adjustment of the mA and/or kV according to patient size and/or use of iterative reconstruction technique. CONTRAST:  75mL OMNIPAQUE IOHEXOL 350 MG/ML SOLN COMPARISON:  None Available. FINDINGS: Lower chest: No acute abnormality.  Heart size is normal. Hepatobiliary: Smooth liver contours. No focal liver lesion is seen. Status post cholecystectomy. The common bile duct measures up to approximately 10 mm in caliber, mildly enlarged and favored to be secondary to postsurgical reservoir effect. Trace central intrahepatic biliary ductal dilatation is also likely postsurgical from prior cholecystectomy. Pancreas: Unremarkable. No pancreatic  ductal dilatation or surrounding inflammatory changes. Spleen: Normal in size without focal abnormality. Tiny normal variant splenule inferior to the spleen. Adrenals/Urinary Tract: Normal bilateral adrenals. The kidneys are symmetric in size without hydronephrosis a subtle region of mild hypoperfusion within the lateral aspect of the right renal midpole (axial series 2, image 37 and coronal series 6, image 63) may represent mild prominence of the regional calyx due to mildly thinned underlying cortex, however this hypoperfusion mildly persists into the delayed phase images (axial series 8, image 16), and may represent mild acute pyelonephritis. Recommend correlation with urinalysis. There is symmetric excretion of contrast into the proximal ureters on the delayed phase images. Within limitations of streak artifact from total right hip arthroplasty hardware, no urinary bladder wall thickening is  seen. Stomach/Bowel: There is mild wall thickening of portions of the distal descending colon (coronal images 70 through 77 and axial series 3 images 44 through 63) separate portions of a mid sigmoid colon (coronal image 93 and axial series 3 images 66 through 70) and the a rectosigmoid junction (axial images 60 through 75 and coronal images 33 through 50). This is compatible with colitis. No high-grade diverticula are seen throughout all the regions of this wall thickening and suspected inflammatory change, however there is a small diverticulum seen at the lateral aspect of the distal descending colon that is included within the regions of colonic wall thickening (axial series 3, image 54 and coronal series 6, image 74). There is fluid within the terminal ileum but no wall thickening to suggest inflammatory change. The appendix is not confidently identified, however no inflammatory changes are seen around the cecum to indicate secondary signs of acute appendicitis. Postsurgical changes are seen of a gastric lap band which  appears grossly in appropriate position. Associated left anterior abdominal wall port also appears in appropriate position. The there are surgical clips around the stomach and surgical suture near the gastroesophageal junction above the gastric band. Additional surgical suture is seen within the region of the mid duodenum, and the more distal duodenum does not appear to cross the midline and likely due to postsurgical change. No dilated loops of small bowel are seen to indicate bowel obstruction. Vascular/Lymphatic: No significant vascular findings are present. No enlarged abdominal or pelvic lymph nodes. Mild atherosclerotic calcifications. Reproductive: The uterus is present.  No gross adnexal mass is seen. Other: No ventral abdominal wall hernia. No free air or free fluid is seen within the abdomen or pelvis. Musculoskeletal: Status post total right hip arthroplasty with associated streak artifact. Minimal dextrocurvature centered at L3. Severe left L3-4 and moderate to severe right L4-5 degenerative disc and endplate changes. IMPRESSION: 1. Mild wall thickening of portions of the distal descending colon, mid sigmoid colon, and rectosigmoid junction, compatible with colitis. There is a single small diverticulum in the region of a distal descending colon wall thickening inflammatory change. Otherwise, no significant diverticulitis. 2. Subtle region of mild low-density within the lateral aspect of the right renal midpole may represent artifact or mild prominence of the regional calyx due to mildly thinned underlying cortex. However, this low-density mildly persists into the delayed phase images possibly representing mild hypoperfusion, and may represent mild acute pyelonephritis. Recommend correlation with urinalysis. 3. Postsurgical changes are seen of a gastric lap band which appears grossly in appropriate position. No evidence of bowel obstruction. Electronically Signed   By: Neita Garnet M.D.   On: 03/08/2023  17:55   DG Chest 2 View  Result Date: 03/08/2023 CLINICAL DATA:  Chest pain. EXAM: CHEST - 2 VIEW COMPARISON:  Chest radiographs 04/20/2017 FINDINGS: Cardiac silhouette and mediastinal contours are within limits. Mildly decreased lung volumes. The lungs are clear. No pleural effusion pneumothorax. Mild multilevel degenerative disc changes of the thoracic spine. IMPRESSION: No active cardiopulmonary disease. Electronically Signed   By: Neita Garnet M.D.   On: 03/08/2023 17:22     Assessment and Plan:   Elevated troponin at pk 242 now decreasing. EKG without acute changes. Echo pending has hx of DM2, HTN, HLD though with wt loss no longer present.  + FH with both parents of heart attacks.  They did smoke and she does not.  If Echo ok then maybe outpt cardiac CTA but defer to Dr. Rennis Golden.  She reports  fatigue as well increased over last few months.  Most likely demand ischemia Colitis per IM Iron def anemia per IM may be adding to fatigue    Risk Assessment/Risk Scores:     TIMI Risk Score for Unstable Angina or Non-ST Elevation MI:   The patient's TIMI risk score is  , which indicates a  % risk of all cause mortality, new or recurrent myocardial infarction or need for urgent revascularization in the next 14 days.          For questions or updates, please contact [facility] Please consult [email or web address] for contact info under    Signed, Nada Boozer, NP  03/09/2023 11:06 AM

## 2023-03-09 NOTE — Progress Notes (Signed)
  Echocardiogram 2D Echocardiogram has been performed.  Delcie Roch 03/09/2023, 4:04 PM

## 2023-03-09 NOTE — Assessment & Plan Note (Signed)
Received IV fluid. -Encourage p.o. hydration

## 2023-03-09 NOTE — Progress Notes (Signed)
  Progress Note   Patient: Renee [name]:[id number] DOB: 1955/12/12 DOA: 03/08/2023     0 DOS: the patient was seen and examined on 03/09/2023   Brief hospital course: Taken from H&P.  Renee [name] is a 66 y.o. female with medical history significant of  hypothyroidism, depression, gastric lap band 2017 and Gastric bypass 2014 presented with diarrhea and abdominal pain.  Some intermittent substernal chest pain, nonradiating.  No fever or chills.  Describes chest pain as sharp lightening bolt strikes for the past 1 month, worse when laying down at night.  CT abdomen and pelvis for concern of colitis involving distal descending, mid sigmoid and rectosigmoid junction.  Started on Cipro and Flagyl.  8/20: Vital stable, leukocytosis resolved, corrected calcium at 9.1 after getting IV replacement in ED. troponin peaked at 242 and trending down. Anemia panel consistent with iron deficiency, started on iron and calcium supplement. Pending echocardiogram, cardiology will do a CT coronary tomorrow.   Assessment and Plan: * Colitis CT imaging concerning for colitis.  Diarrhea has been resolved, GI pathogen panel and C. difficile was ordered but patient did not had any BM while in the hospital. -Discontinue precautions -Continue with IV Cipro and Flagyl-can be converted to p.o. on discharge  Chest pain Patient with mild chest discomfort especially with eating. Troponin peaked at 242, likely secondary to demand ischemia. Cardiology is consulted and they are planning to do echocardiogram and CT coronaries. -Follow-up cardiology recommendations  Iron deficiency anemia Anemia panel concerning for iron deficiency.  Colonoscopy done in the past was unremarkable. -Start her on iron supplement -Outpatient follow-up  Hypothyroidism -Continue Synthroid  Dehydration Received IV fluid. -Encourage p.o. hydration  Hypocalcemia Received IV calcium in ED, corrected calcium 9.1 today -Start her on  p.o. supplement   Subjective: Patient was seen and examined today.  No more abdominal pain or diarrhea.  Did not had any bowel movement since in the hospital.  Mild intermittent chest discomfort especially with eating.  Physical Exam: Vitals:   03/09/23 0012 03/09/23 0435 03/09/23 0549 03/09/23 0811  BP: 125/60 125/68  130/65  Pulse: 66 73  73  Resp: 18 18    Temp: 98.3 F (36.8 C) 98.1 F (36.7 C)    TempSrc: Oral Oral    SpO2: 97% 97%  97%  Weight:   73.5 kg   Height:       General.  Well-developed lady, in no acute distress. Pulmonary.  Lungs clear bilaterally, normal respiratory effort. CV.  Regular rate and rhythm, no JVD, rub or murmur. Abdomen.  Soft, nontender, nondistended, BS positive. CNS.  Alert and oriented .  No focal neurologic deficit. Extremities.  No edema, no cyanosis, pulses intact and symmetrical. Psychiatry.  Judgment and insight appears normal.   Data Reviewed: Prior data reviewed  Family Communication: Discussed with patient  Disposition: Status is: Observation The patient remains OBS appropriate and will d/c before 2 midnights.  Planned Discharge Destination: Home  Time spent: 45 minutes  This record has been created using Conservation officer, historic buildings. Errors have been sought and corrected,but may not always be located. Such creation errors do not reflect on the standard of care.   Author: Arnetha Courser, MD 03/09/2023 1:02 PM  For on call review [email or web address].

## 2023-03-09 NOTE — Assessment & Plan Note (Signed)
Continue Synthroid °

## 2023-03-09 NOTE — Assessment & Plan Note (Signed)
Patient with mild chest discomfort especially with eating. Troponin peaked at 242, likely secondary to demand ischemia. Cardiology is consulted and they are planning to do echocardiogram and CT coronaries. -Follow-up cardiology recommendations

## 2023-03-10 ENCOUNTER — Observation Stay (HOSPITAL_BASED_OUTPATIENT_CLINIC_OR_DEPARTMENT_OTHER): Payer: Medicare Other

## 2023-03-10 DIAGNOSIS — K529 Noninfective gastroenteritis and colitis, unspecified: Secondary | ICD-10-CM | POA: Diagnosis not present

## 2023-03-10 DIAGNOSIS — I2510 Atherosclerotic heart disease of native coronary artery without angina pectoris: Secondary | ICD-10-CM

## 2023-03-10 DIAGNOSIS — R7989 Other specified abnormal findings of blood chemistry: Secondary | ICD-10-CM | POA: Diagnosis not present

## 2023-03-10 LAB — T3: T3, Total: 103 ng/dL (ref 71–180)

## 2023-03-10 LAB — GASTROINTESTINAL PANEL BY PCR, STOOL (REPLACES STOOL CULTURE)

## 2023-03-10 MED ORDER — METRONIDAZOLE 500 MG PO TABS: 500.0000 mg | ORAL_TABLET | Freq: Two times a day (BID) | ORAL | 0 refills | Status: AC

## 2023-03-10 MED ORDER — FE FUM-VIT C-VIT B12-FA 460-60-0.01-1 MG PO CAPS
1.0000 | ORAL_CAPSULE | Freq: Every day | ORAL | 1 refills | Status: DC
Start: 2023-03-11 — End: 2023-05-07
  Filled 2023-03-11: qty 30, 30d supply, fill #0
  Filled 2023-04-06: qty 30, 30d supply, fill #1

## 2023-03-10 MED ORDER — CIPROFLOXACIN HCL 500 MG PO TABS: 500.0000 mg | ORAL_TABLET | Freq: Two times a day (BID) | ORAL | 0 refills | Status: AC

## 2023-03-10 MED ORDER — LEVOTHYROXINE SODIUM 200 MCG PO TABS
200.0000 ug | ORAL_TABLET | ORAL | Status: AC
Start: 2023-03-10 — End: ?

## 2023-03-10 MED ORDER — NITROGLYCERIN 0.4 MG SL SUBL
0.8000 mg | SUBLINGUAL_TABLET | Freq: Once | SUBLINGUAL | Status: AC
  Administered 2023-03-10: 0.8 mg via SUBLINGUAL

## 2023-03-10 MED ORDER — PAROXETINE HCL 20 MG PO TABS: 40.0000 mg | ORAL_TABLET | Freq: Every day | ORAL | Status: AC

## 2023-03-10 MED ORDER — BUPROPION HCL ER (XL) 150 MG PO TB24: 150.0000 mg | ORAL_TABLET | Freq: Every day | ORAL | Status: AC

## 2023-03-10 MED ORDER — LEVOTHYROXINE SODIUM 50 MCG PO TABS: 50.0000 ug | ORAL_TABLET | Freq: Every day | ORAL | Status: AC

## 2023-03-10 MED ORDER — IOHEXOL 350 MG/ML SOLN
80.0000 mL | Freq: Once | INTRAVENOUS | Status: AC | PRN
  Administered 2023-03-10: 80 mL via INTRAVENOUS

## 2023-03-10 MED ORDER — NITROGLYCERIN 0.4 MG SL SUBL
SUBLINGUAL_TABLET | SUBLINGUAL | Status: AC
  Filled 2023-03-10: qty 2

## 2023-03-10 NOTE — Plan of Care (Signed)

## 2023-03-10 NOTE — Progress Notes (Signed)
DAILY PROGRESS NOTE   Patient Name: Renee [name] Date of Encounter: 03/10/2023 Cardiologist: None  Chief Complaint   No chest pain overnight  Patient Profile   Renee [name] is a 67 y.o. 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   Subjective   Feels well. No chest pain. Echo yesterday showed normal LVEF 60-65%, no regional wMA's - normal diastolic function. Plan for CCTA today.  Objective   Vitals:   03/09/23 2213 03/10/23 0122 03/10/23 0642 03/10/23 0644  BP: 132/67 130/70 (!) 73/47 (!) 150/78  Pulse: 72 79 72 70  Resp: 18 18 18    Temp: 98 F (36.7 C) 97.8 F (36.6 C) (!) 97.5 F (36.4 C)   TempSrc: Oral Oral Oral   SpO2: 98% 94% 98%   Weight:      Height:        Intake/Output Summary (Last 24 hours) at 03/10/2023 0900 Last data filed at 03/10/2023 0550 Gross per 24 hour  Intake 776 ml  Output --  Net 776 ml   Filed Weights   03/08/23 1422 03/08/23 2057 03/09/23 0549  Weight: 72.6 kg 73.7 kg 73.5 kg    Physical Exam   General appearance: alert and no distress Lungs: clear to auscultation bilaterally Heart: regular rate and rhythm, S1, S2 normal, no murmur, click, rub or gallop Extremities: extremities normal, atraumatic, no cyanosis or edema Neurologic: Grossly normal  Inpatient Medications    Scheduled Meds:  buPROPion  150 mg Oral QHS   calcium carbonate  1 tablet Oral BID WC   Fe Fum-Vit C-Vit B12-FA  1 capsule Oral QPC breakfast   levothyroxine  200 mcg Oral Once per day on Sunday Monday Wednesday Thursday Friday Saturday   levothyroxine  50 mcg Oral QAC breakfast   PARoxetine  40 mg Oral QHS    Continuous Infusions:  ciprofloxacin 400 mg (03/10/23 0550)   metronidazole 500 mg (03/10/23 0550)    PRN Meds: acetaminophen **OR** acetaminophen,  fentaNYL (SUBLIMAZE) injection, HYDROcodone-acetaminophen, ondansetron **OR** ondansetron (ZOFRAN) IV   Labs   Results for orders placed or performed during the hospital encounter of 03/08/23 (from the past 48 hour(s))  Lipase, blood     Status: None   Collection Time: 03/08/23  2:57 PM  Result Value Ref Range   Lipase 20 11 - 51 U/L    Comment: Performed at [facility], [address], [place], [place] [address]  Comprehensive metabolic panel     Status: Abnormal   Collection Time: 03/08/23  2:57 PM  Result Value Ref Range   Sodium 140 135 - 145 mmol/L   Potassium 3.7 3.5 - 5.1 mmol/L    Comment: HEMOLYSIS AT THIS LEVEL MAY AFFECT RESULT   Chloride 112 (H) 98 - 111 mmol/L   CO2 15 (L) 22 - 32 mmol/L   Glucose, Bld 177 (H) 70 - 99 mg/dL    Comment: Glucose reference range applies only to samples taken after fasting for at least 8 hours.   BUN 11 8 - 23 mg/dL   Creatinine, Ser 2.95 0.44 - 1.00 mg/dL   Calcium 7.2 (L) 8.9 - 10.3 mg/dL   Total Protein 5.5 (L) 6.5 - 8.1 g/dL   Albumin 2.9 (L) 3.5 - 5.0 g/dL   AST 28 15 -  41 U/L    Comment: HEMOLYSIS AT THIS LEVEL MAY AFFECT RESULT   ALT 15 0 - 44 U/L    Comment: HEMOLYSIS AT THIS LEVEL MAY AFFECT RESULT   Alkaline Phosphatase 57 38 - 126 U/L   Total Bilirubin 0.8 0.3 - 1.2 mg/dL    Comment: HEMOLYSIS AT THIS LEVEL MAY AFFECT RESULT   GFR, Estimated >60 >60 mL/min    Comment: (NOTE) Calculated using the CKD-EPI Creatinine Equation (2021)    Anion gap 13 5 - 15    Comment: Performed at [facility], [address], [place], [place] [address]  CBC     Status: Abnormal   Collection Time: 03/08/23  2:57 PM  Result Value Ref Range   WBC 14.2 (H) 4.0 - 10.5 K/uL   RBC 3.69 (L) 3.87 - 5.11 MIL/uL   Hemoglobin 10.9 (L) 12.0 - 15.0 g/dL   HCT 60.4 (L) 54.0 - 98.1 %   MCV 95.7 80.0 - 100.0 fL   MCH 29.5 26.0 - 34.0 pg   MCHC 30.9 30.0 - 36.0 g/dL   RDW 19.1 47.8 - 29.5 %   Platelets 183 150 - 400 K/uL   nRBC 0.0 0.0 -  0.2 %    Comment: Performed at [facility], [address], [place], [place] [address]  Troponin I (High Sensitivity)     Status: Abnormal   Collection Time: 03/08/23  2:57 PM  Result Value Ref Range   Troponin I (High Sensitivity) 19 (H) <18 ng/L    Comment: (NOTE) Elevated high sensitivity troponin I (hsTnI) values and significant  changes across serial measurements may suggest ACS but many other  chronic and acute conditions are known to elevate hsTnI results.  Refer to the "Links" section for chest pain algorithms and additional  guidance. Performed at [facility], [address] [place], [place] [address]   Lactic acid, plasma     Status: Abnormal   Collection Time: 03/08/23  5:03 PM  Result Value Ref Range   Lactic Acid, Venous 2.2 (HH) 0.5 - 1.9 mmol/L    Comment: CRITICAL RESULT CALLED TO, READ BACK BY AND VERIFIED WITH C SCHILLING RN AT [phone] BY D LONG Performed at [facility], [address], [place], [place] [address]   Troponin I (High Sensitivity)     Status: Abnormal   Collection Time: 03/08/23  5:03 PM  Result Value Ref Range   Troponin I (High Sensitivity) 85 (H) <18 ng/L    Comment: RESULT CALLED TO, READ BACK BY AND VERIFIED WITH C KHOURI RN AT [phone] BY D LONG (NOTE) Elevated high sensitivity troponin I (hsTnI) values and significant  changes across serial measurements may suggest ACS but many other  chronic and acute conditions are known to elevate hsTnI results.  Refer to the "Links" section for chest pain algorithms and additional  guidance. Performed at [facility], [address], [place], [place] [address]   Urinalysis, Routine w reflex microscopic -Urine, Clean Catch     Status: Abnormal   Collection Time: 03/08/23  5:19 PM  Result Value Ref Range   Color, Urine YELLOW YELLOW   APPearance CLEAR CLEAR   Specific Gravity, Urine 1.020 1.005 - 1.030   pH 5.0 5.0 - 8.0   Glucose, UA NEGATIVE NEGATIVE mg/dL   Hgb  urine dipstick MODERATE (A) NEGATIVE   Bilirubin Urine NEGATIVE NEGATIVE   Ketones, ur 5 (A) NEGATIVE mg/dL  Protein, ur NEGATIVE NEGATIVE mg/dL   Nitrite NEGATIVE NEGATIVE   Leukocytes,Ua NEGATIVE NEGATIVE   RBC / HPF 6-10 0 - 5 RBC/hpf   WBC, UA 0-5 0 - 5 WBC/hpf   Bacteria, UA RARE (A) NONE SEEN   Squamous Epithelial / HPF 0-5 0 - 5 /HPF   Mucus PRESENT     Comment: Performed at [facility], [address] [place], [place] [address]  I-Stat venous blood gas, [facility], [facility], [facility])     Status: Abnormal   Collection Time: 03/08/23  5:29 PM  Result Value Ref Range   pH, Ven 7.528 (H) 7.25 - 7.43   pCO2, Ven 21.5 (L) 44 - 60 mmHg   pO2, Ven 127 (H) 32 - 45 mmHg   Bicarbonate 17.9 (L) 20.0 - 28.0 mmol/L   TCO2 19 (L) 22 - 32 mmol/L   O2 Saturation 99 %   Acid-base deficit 3.0 (H) 0.0 - 2.0 mmol/L   Sodium 137 135 - 145 mmol/L   Potassium 4.7 3.5 - 5.1 mmol/L   Calcium, Ion 0.75 (LL) 1.15 - 1.40 mmol/L   HCT 33.0 (L) 36.0 - 46.0 %   Hemoglobin 11.2 (L) 12.0 - 15.0 g/dL   Sample type VENOUS    Comment NOTIFIED PHYSICIAN   C Difficile Quick Screen w PCR reflex     Status: None   Collection Time: 03/08/23  7:27 PM   Specimen: STOOL  Result Value Ref Range   C Diff antigen NEGATIVE NEGATIVE   C Diff toxin NEGATIVE NEGATIVE   C Diff interpretation No C. difficile detected.     Comment: Performed at [facility], [address] [place], [place] [address]  Lactic acid, plasma     Status: None   Collection Time: 03/08/23  8:02 PM  Result Value Ref Range   Lactic Acid, Venous 1.6 0.5 - 1.9 mmol/L    Comment: Performed at [facility], [address], [place], [place] [address]  CK     Status: None   Collection Time: 03/08/23  8:02 PM  Result Value Ref Range   Total CK 78 38 - 234 U/L    Comment: Performed at [facility], [address], [place], [place] [address]  Magnesium     Status: None   Collection Time: 03/08/23  8:02 PM  Result Value Ref Range    Magnesium 1.7 1.7 - 2.4 mg/dL    Comment: Performed at [facility], [address] [place], [place] [address]  Procalcitonin     Status: None   Collection Time: 03/08/23  8:02 PM  Result Value Ref Range   Procalcitonin <0.10 ng/mL    Comment:        Interpretation: PCT (Procalcitonin) <= 0.5 ng/mL: Systemic infection (sepsis) is not likely. Local bacterial infection is possible. (NOTE)       Sepsis PCT Algorithm           Lower Respiratory Tract                                      Infection PCT Algorithm    ----------------------------     ----------------------------         PCT < 0.25 ng/mL                PCT < 0.10 ng/mL  Strongly encourage             Strongly discourage   discontinuation of antibiotics    initiation of antibiotics    ----------------------------     -----------------------------       PCT 0.25 - 0.50 ng/mL            PCT 0.10 - 0.25 ng/mL               OR       >80% decrease in PCT            Discourage initiation of                                            antibiotics      Encourage discontinuation           of antibiotics    ----------------------------     -----------------------------         PCT >= 0.50 ng/mL              PCT 0.26 - 0.50 ng/mL               AND        <80% decrease in PCT             Encourage initiation of                                             antibiotics       Encourage continuation           of antibiotics    ----------------------------     -----------------------------        PCT >= 0.50 ng/mL                  PCT > 0.50 ng/mL               AND         increase in PCT                  Strongly encourage                                      initiation of antibiotics    Strongly encourage escalation           of antibiotics                                     -----------------------------                                           PCT <= 0.25 ng/mL                                                 OR                                         >  80% decrease in PCT                                      Discontinue / Do not initiate                                             antibiotics  Performed at [facility], [address] [place], [place] [address]   Hepatic function panel     Status: Abnormal   Collection Time: 03/08/23  8:02 PM  Result Value Ref Range   Total Protein 6.3 (L) 6.5 - 8.1 g/dL   Albumin 3.4 (L) 3.5 - 5.0 g/dL   AST 23 15 - 41 U/L   ALT 21 0 - 44 U/L   Alkaline Phosphatase 66 38 - 126 U/L   Total Bilirubin 0.1 (L) 0.3 - 1.2 mg/dL   Bilirubin, Direct 0.1 0.0 - 0.2 mg/dL   Indirect Bilirubin 0.0 (L) 0.3 - 0.9 mg/dL    Comment: Performed at [facility], [address], [place], [place] [address]  Phosphorus     Status: None   Collection Time: 03/08/23  8:02 PM  Result Value Ref Range   Phosphorus 3.5 2.5 - 4.6 mg/dL    Comment: Performed at [facility], [address] [place], [place] [address]  Troponin I (High Sensitivity)     Status: Abnormal   Collection Time: 03/08/23  8:02 PM  Result Value Ref Range   Troponin I (High Sensitivity) 204 (HH) <18 ng/L    Comment: CRITICAL RESULT CALLED TO, READ BACK BY AND VERIFIED WITH D. BULU RN 03/08/23 @2131  BY J. WHITE (NOTE) Elevated high sensitivity troponin I (hsTnI) values and significant  changes across serial measurements may suggest ACS but many other  chronic and acute conditions are known to elevate hsTnI results.  Refer to the "Links" section for chest pain algorithms and additional  guidance. Performed at [facility], [address] [place], [place] [address]   Iron and TIBC     Status: Abnormal   Collection Time: 03/08/23  8:02 PM  Result Value Ref Range   Iron 21 (L) 28 - 170 ug/dL   TIBC 295 (H) 621 - 308 ug/dL   Saturation Ratios 4 (L) 10.4 - 31.8 %   UIBC 465 ug/dL    Comment: Performed at [facility], [address] [place], [place] [address]  Ferritin     Status:  Abnormal   Collection Time: 03/08/23  8:02 PM  Result Value Ref Range   Ferritin 6 (L) 11 - 307 ng/mL    Comment: Performed at [facility], [address] [place], [place] [address]  Reticulocytes     Status: Abnormal   Collection Time: 03/08/23  8:02 PM  Result Value Ref Range   Retic Ct Pct 1.4 0.4 - 3.1 %   RBC. 4.06 3.87 - 5.11 MIL/uL   Retic Count, Absolute 58.5 19.0 - 186.0 K/uL   Immature Retic Fract 16.3 (H) 2.3 - 15.9 %    Comment: Performed at [facility], [address], [place], [place] [address]  Basic metabolic panel     Status: Abnormal   Collection Time: 03/08/23  8:02 PM  Result Value  Ref Range   Sodium 141 135 - 145 mmol/L   Potassium 4.1 3.5 - 5.1 mmol/L   Chloride 104 98 - 111 mmol/L   CO2 23 22 - 32 mmol/L   Glucose, Bld 103 (H) 70 - 99 mg/dL    Comment: Glucose reference range applies only to samples taken after fasting for at least 8 hours.   BUN 8 8 - 23 mg/dL   Creatinine, Ser 4.09 0.44 - 1.00 mg/dL   Calcium 9.1 8.9 - 81.1 mg/dL   GFR, Estimated >91 >47 mL/min    Comment: (NOTE) Calculated using the CKD-EPI Creatinine Equation (2021)    Anion gap 14 5 - 15    Comment: Performed at [facility], [address] [place], [place] [address]  TSH     Status: Abnormal   Collection Time: 03/08/23  8:02 PM  Result Value Ref Range   TSH 0.282 (L) 0.350 - 4.500 uIU/mL    Comment: Performed by a 3rd Generation assay with a functional sensitivity of <=0.01 uIU/mL. Performed at [facility], [address] [place], [place] [address]   Troponin I (High Sensitivity)     Status: Abnormal   Collection Time: 03/08/23  9:32 PM  Result Value Ref Range   Troponin I (High Sensitivity) 242 (HH) <18 ng/L    Comment: CRITICAL VALUE NOTED. VALUE IS CONSISTENT WITH PREVIOUSLY REPORTED/CALLED VALUE (NOTE) Elevated high sensitivity troponin I (hsTnI) values and significant  changes across serial measurements may suggest ACS but many other   chronic and acute conditions are known to elevate hsTnI results.  Refer to the "Links" section for chest pain algorithms and additional  guidance. Performed at [facility], [address], [place], [place] [address]   Prealbumin     Status: Abnormal   Collection Time: 03/09/23  3:46 AM  Result Value Ref Range   Prealbumin 15 (L) 18 - 38 mg/dL    Comment: Performed at [facility], [address] [place], [place] [address]  Vitamin B12     Status: None   Collection Time: 03/09/23  3:46 AM  Result Value Ref Range   Vitamin B-12 541 180 - 914 pg/mL    Comment: (NOTE) This assay is not validated for testing neonatal or myeloproliferative syndrome specimens for Vitamin B12 levels. Performed at [facility], [address], [place], [place] [address]   HIV Antibody (routine testing w rflx)     Status: None   Collection Time: 03/09/23  3:46 AM  Result Value Ref Range   HIV Screen 4th Generation wRfx Non Reactive Non Reactive    Comment: Performed at [facility], [address] [place], [place] [address]  Magnesium     Status: None   Collection Time: 03/09/23  3:46 AM  Result Value Ref Range   Magnesium 1.7 1.7 - 2.4 mg/dL    Comment: Performed at [facility], [address], [place], [place] [address]  Phosphorus     Status: None   Collection Time: 03/09/23  3:46 AM  Result Value Ref Range   Phosphorus 3.3 2.5 - 4.6 mg/dL    Comment: Performed at [facility], [address], [place], [place] [address]  Comprehensive metabolic panel     Status: Abnormal   Collection Time: 03/09/23  3:46 AM  Result Value Ref Range   Sodium 137 135 - 145 mmol/L   Potassium 3.7 3.5 - 5.1  mmol/L   Chloride 105 98 - 111 mmol/L   CO2 24 22 - 32 mmol/L   Glucose, Bld 99 70 - 99 mg/dL    Comment: Glucose reference range applies only to samples taken after fasting for at least 8 hours.   BUN 7 (L) 8 - 23 mg/dL   Creatinine, Ser 9.52 0.44 - 1.00 mg/dL   Calcium  8.2 (L) 8.9 - 10.3 mg/dL   Total Protein 5.5 (L) 6.5 - 8.1 g/dL   Albumin 2.9 (L) 3.5 - 5.0 g/dL   AST 20 15 - 41 U/L   ALT 18 0 - 44 U/L   Alkaline Phosphatase 57 38 - 126 U/L   Total Bilirubin 0.6 0.3 - 1.2 mg/dL   GFR, Estimated >84 >13 mL/min    Comment: (NOTE) Calculated using the CKD-EPI Creatinine Equation (2021)    Anion gap 8 5 - 15    Comment: Performed at [facility], [address] [place], [place] [address]  CBC     Status: Abnormal   Collection Time: 03/09/23  3:46 AM  Result Value Ref Range   WBC 9.6 4.0 - 10.5 K/uL   RBC 3.54 (L) 3.87 - 5.11 MIL/uL   Hemoglobin 10.8 (L) 12.0 - 15.0 g/dL   HCT 02.7 (L) 25.3 - 66.4 %   MCV 94.6 80.0 - 100.0 fL   MCH 30.5 26.0 - 34.0 pg   MCHC 32.2 30.0 - 36.0 g/dL   RDW 40.3 47.4 - 25.9 %   Platelets 291 150 - 400 K/uL   nRBC 0.0 0.0 - 0.2 %    Comment: Performed at [facility], [address], [place], [place] [address]  T3     Status: None   Collection Time: 03/09/23  3:46 AM  Result Value Ref Range   T3, Total 103 71 - 180 ng/dL    Comment: (NOTE) Performed At: [facility] [address] [place], [place] [id number] Jolene Schimke MD OA:[phone]   T4, free     Status: Abnormal   Collection Time: 03/09/23  3:46 AM  Result Value Ref Range   Free T4 1.38 (H) 0.61 - 1.12 ng/dL    Comment: (NOTE) Biotin ingestion may interfere with free T4 tests. If the results are inconsistent with the TSH level, previous test results, or the clinical presentation, then consider biotin interference. If needed, order repeat testing after stopping biotin. Performed at [facility], [address] [place], [place] [address]   Folate     Status: None   Collection Time: 03/09/23  3:46 AM  Result Value Ref Range   Folate 22.7 >5.9 ng/mL    Comment: RESULT CONFIRMED BY MANUAL DILUTION Performed at [facility], [address], [place], [place] [address]   Troponin I (High Sensitivity)     Status:  Abnormal   Collection Time: 03/09/23  9:02 AM  Result Value Ref Range   Troponin I (High Sensitivity) 88 (H) <18 ng/L    Comment: DELTA CHECK NOTED (NOTE) Elevated high sensitivity troponin I (hsTnI) values and significant  changes across serial measurements may suggest ACS but many other  chronic and acute conditions are known to elevate hsTnI results.  Refer to the "Links" section for chest pain algorithms and additional  guidance. Performed at [facility], [address] [place], [place] [address]   Troponin I (High Sensitivity)     Status: Abnormal   Collection Time: 03/09/23 12:19 PM  Result Value Ref Range   Troponin I (High Sensitivity) 70 (H) <18 ng/L    Comment: (NOTE) Elevated high sensitivity troponin I (hsTnI) values and significant  changes across serial measurements may suggest ACS but many other  chronic and acute conditions are known to elevate hsTnI results.  Refer to the "Links" section for chest pain algorithms and additional  guidance. Performed at [facility], [address], [place], [place] [address]     ECG   N/A  Telemetry   Sinus rhythm - Personally Reviewed  Radiology    ECHOCARDIOGRAM COMPLETE  Result Date: 03/09/2023    ECHOCARDIOGRAM REPORT   Patient Name:   CHERYLENE GRIFKA Date of Exam: 03/09/2023 Medical Rec #:  [id number]       Height:       62.0 in Accession #:    [id number]      Weight:       162.0 lb Date of Birth:  Sep 19, 1955        BSA:          1.748 m Patient Age:    67 years        BP:           138/73 mmHg Patient Gender: F               HR:           81 bpm. Exam Location:  Inpatient Procedure: 2D Echo, Color Doppler and Cardiac Doppler Indications:    chest pain  History:        Patient has no prior history of Echocardiogram examinations.  Sonographer:    Delcie Roch RDCS Referring Phys: [id number] ANASTASSIA DOUTOVA IMPRESSIONS  1. Left ventricular ejection fraction, by estimation, is 60 to 65%. The left ventricle has  normal function. The left ventricle has no regional wall motion abnormalities. Left ventricular diastolic parameters were normal.  2. Right ventricular systolic function is normal. The right ventricular size is normal. There is normal pulmonary artery systolic pressure.  3. The mitral valve is normal in structure. No evidence of mitral valve regurgitation. No evidence of mitral stenosis.  4. The aortic valve is normal in structure. Aortic valve regurgitation is not visualized. No aortic stenosis is present.  5. The inferior vena cava is normal in size with greater than 50% respiratory variability, suggesting right atrial pressure of 3 mmHg. FINDINGS  Left Ventricle: Left ventricular ejection fraction, by estimation, is 60 to 65%. The left ventricle has normal function. The left ventricle has no regional wall motion abnormalities. The left ventricular internal cavity size was normal in size. There is  no left ventricular hypertrophy. Left ventricular diastolic parameters were normal. Normal left ventricular filling pressure. Right Ventricle: The right ventricular size is normal. No increase in right ventricular wall thickness. Right ventricular systolic function is normal. There is normal pulmonary artery systolic pressure. The tricuspid regurgitant velocity is 2.08 m/s, and  with an assumed right atrial pressure of 3 mmHg, the estimated right ventricular systolic pressure is 20.3 mmHg. Left Atrium: Left atrial size was normal in size. Right Atrium: Right atrial size was normal in size. Pericardium: There is no evidence of pericardial effusion. Mitral Valve: The mitral valve is normal in structure. No evidence of mitral valve regurgitation. No evidence of mitral valve stenosis. Tricuspid Valve: The tricuspid valve is normal in structure. Tricuspid valve regurgitation is trivial. No evidence of tricuspid stenosis. Aortic Valve: The aortic valve is normal in structure. Aortic valve  regurgitation is not visualized. No  aortic stenosis is present. Pulmonic Valve: The pulmonic valve was normal in structure. Pulmonic valve regurgitation is not visualized. No evidence of pulmonic stenosis. Aorta: The aortic root is normal in size and structure. Venous: The inferior vena cava is normal in size with greater than 50% respiratory variability, suggesting right atrial pressure of 3 mmHg. IAS/Shunts: No atrial level shunt detected by color flow Doppler.  LEFT VENTRICLE PLAX 2D LVIDd:         4.30 cm   Diastology LVIDs:         2.30 cm   LV e' medial:    9.36 cm/s LV PW:         0.90 cm   LV E/e' medial:  9.0 LV IVS:        0.80 cm   LV e' lateral:   12.90 cm/s LVOT diam:     1.80 cm   LV E/e' lateral: 6.5 LV SV:         58 LV SV Index:   33 LVOT Area:     2.54 cm  RIGHT VENTRICLE             IVC RV Basal diam:  2.50 cm     IVC diam: 1.70 cm RV S prime:     12.50 cm/s TAPSE (M-mode): 2.0 cm LEFT ATRIUM             Index        RIGHT ATRIUM           Index LA diam:        3.80 cm 2.17 cm/m   RA Area:     11.70 cm LA Vol (A2C):   42.5 ml 24.31 ml/m  RA Volume:   24.00 ml  13.73 ml/m LA Vol (A4C):   56.6 ml 32.38 ml/m LA Biplane Vol: 50.3 ml 28.77 ml/m  AORTIC VALVE LVOT Vmax:   116.00 cm/s LVOT Vmean:  79.800 cm/s LVOT VTI:    0.229 m  AORTA Ao Root diam: 2.50 cm Ao Asc diam:  3.10 cm MITRAL VALVE               TRICUSPID VALVE MV Area (PHT): 4.80 cm    TR Peak grad:   17.3 mmHg MV Decel Time: 158 msec    TR Vmax:        208.00 cm/s MV E velocity: 83.90 cm/s MV A velocity: 85.90 cm/s  SHUNTS MV E/A ratio:  0.98        Systemic VTI:  0.23 m                            Systemic Diam: 1.80 cm Armanda Magic MD Electronically signed by Armanda Magic MD Signature Date/Time: 03/09/2023/4:27:39 PM    Final    CT ABDOMEN PELVIS W CONTRAST  Result Date: 03/08/2023 CLINICAL DATA:  Left lower quadrant pain. Vomiting. Diarrhea. Lower abdominal pain radiating to the left flank and back. EXAM: CT ABDOMEN AND PELVIS WITH CONTRAST TECHNIQUE:  Multidetector CT imaging of the abdomen and pelvis was performed using the standard protocol following bolus administration of intravenous contrast. RADIATION DOSE REDUCTION: This exam was performed according to the departmental dose-optimization program which includes automated exposure control, adjustment of the mA and/or kV according to patient size and/or use of iterative reconstruction technique. CONTRAST:  75mL OMNIPAQUE IOHEXOL 350 MG/ML SOLN COMPARISON:  None Available. FINDINGS: Lower chest: No acute  abnormality.  Heart size is normal. Hepatobiliary: Smooth liver contours. No focal liver lesion is seen. Status post cholecystectomy. The common bile duct measures up to approximately 10 mm in caliber, mildly enlarged and favored to be secondary to postsurgical reservoir effect. Trace central intrahepatic biliary ductal dilatation is also likely postsurgical from prior cholecystectomy. Pancreas: Unremarkable. No pancreatic ductal dilatation or surrounding inflammatory changes. Spleen: Normal in size without focal abnormality. Tiny normal variant splenule inferior to the spleen. Adrenals/Urinary Tract: Normal bilateral adrenals. The kidneys are symmetric in size without hydronephrosis a subtle region of mild hypoperfusion within the lateral aspect of the right renal midpole (axial series 2, image 37 and coronal series 6, image 63) may represent mild prominence of the regional calyx due to mildly thinned underlying cortex, however this hypoperfusion mildly persists into the delayed phase images (axial series 8, image 16), and may represent mild acute pyelonephritis. Recommend correlation with urinalysis. There is symmetric excretion of contrast into the proximal ureters on the delayed phase images. Within limitations of streak artifact from total right hip arthroplasty hardware, no urinary bladder wall thickening is seen. Stomach/Bowel: There is mild wall thickening of portions of the distal descending colon  (coronal images 70 through 77 and axial series 3 images 44 through 63) separate portions of a mid sigmoid colon (coronal image 93 and axial series 3 images 66 through 70) and the a rectosigmoid junction (axial images 60 through 75 and coronal images 33 through 50). This is compatible with colitis. No high-grade diverticula are seen throughout all the regions of this wall thickening and suspected inflammatory change, however there is a small diverticulum seen at the lateral aspect of the distal descending colon that is included within the regions of colonic wall thickening (axial series 3, image 54 and coronal series 6, image 74). There is fluid within the terminal ileum but no wall thickening to suggest inflammatory change. The appendix is not confidently identified, however no inflammatory changes are seen around the cecum to indicate secondary signs of acute appendicitis. Postsurgical changes are seen of a gastric lap band which appears grossly in appropriate position. Associated left anterior abdominal wall port also appears in appropriate position. The there are surgical clips around the stomach and surgical suture near the gastroesophageal junction above the gastric band. Additional surgical suture is seen within the region of the mid duodenum, and the more distal duodenum does not appear to cross the midline and likely due to postsurgical change. No dilated loops of small bowel are seen to indicate bowel obstruction. Vascular/Lymphatic: No significant vascular findings are present. No enlarged abdominal or pelvic lymph nodes. Mild atherosclerotic calcifications. Reproductive: The uterus is present.  No gross adnexal mass is seen. Other: No ventral abdominal wall hernia. No free air or free fluid is seen within the abdomen or pelvis. Musculoskeletal: Status post total right hip arthroplasty with associated streak artifact. Minimal dextrocurvature centered at L3. Severe left L3-4 and moderate to severe right  L4-5 degenerative disc and endplate changes. IMPRESSION: 1. Mild wall thickening of portions of the distal descending colon, mid sigmoid colon, and rectosigmoid junction, compatible with colitis. There is a single small diverticulum in the region of a distal descending colon wall thickening inflammatory change. Otherwise, no significant diverticulitis. 2. Subtle region of mild low-density within the lateral aspect of the right renal midpole may represent artifact or mild prominence of the regional calyx due to mildly thinned underlying cortex. However, this low-density mildly persists into the delayed phase images possibly representing  mild hypoperfusion, and may represent mild acute pyelonephritis. Recommend correlation with urinalysis. 3. Postsurgical changes are seen of a gastric lap band which appears grossly in appropriate position. No evidence of bowel obstruction. Electronically Signed   By: Neita Garnet M.D.   On: 03/08/2023 17:55   DG Chest 2 View  Result Date: 03/08/2023 CLINICAL DATA:  Chest pain. EXAM: CHEST - 2 VIEW COMPARISON:  Chest radiographs 04/20/2017 FINDINGS: Cardiac silhouette and mediastinal contours are within limits. Mildly decreased lung volumes. The lungs are clear. No pleural effusion pneumothorax. Mild multilevel degenerative disc changes of the thoracic spine. IMPRESSION: No active cardiopulmonary disease. Electronically Signed   By: Neita Garnet M.D.   On: 03/08/2023 17:22    Cardiac Studies   See echo above  Assessment   Principal Problem:   Colitis Active Problems:   Iron deficiency anemia   Hypothyroidism   Chest pain   Dehydration   Hypocalcemia   Plan   No chest pain - echo appears essentially normal. Plan for CCTA today. If there are no significant findings, could be discharged home later today- follow-up then PRN.  Time Spent Directly with Patient:  I have spent a total of 25 minutes with the patient reviewing hospital notes, telemetry, EKGs, labs  and examining the patient as well as establishing an assessment and plan that was discussed personally with the patient.  > 50% of time was spent in direct patient care.  Length of Stay:  LOS: 0 days   Chrystie Nose, MD, [facility], FACP  [place]  [facility]  Medical Director of the [facility] Diplomate of the American Board of Clinical Lipidology Attending Cardiologist  Direct Dial: [phone]  Fax: [phone]  Website:  [email or web address] Nicely Danna Sewell 03/10/2023, 9:00 AM

## 2023-03-10 NOTE — Progress Notes (Signed)
Patient discharged to home with self care, she verbalized an understanding of all discharge instructions and left the unit in no distress

## 2023-03-10 NOTE — Discharge Instructions (Signed)

## 2023-03-10 NOTE — Discharge Summary (Signed)
Physician Discharge Summary  DONISHIA SHAMP ZOX:[id number] DOB: [birth year]  PCP: Lance Bosch, NP  Admitted from: Home Discharged to: Home  Admit date: 03/08/2023 Discharge date: 03/10/2023  Recommendations for Outpatient Follow-up:    Follow-up Information     Lance Bosch, NP Follow up in 1 week(s).   Specialty: Nurse Practitioner Why: To be seen with repeat labs (CBC & BMP).  Kindly address Synthroid dosing during this visit. Contact information: [address] [place] [address] [phone]         Chrystie Nose, MD. Schedule an appointment as soon as possible for a visit.   Specialty: Cardiology Why: As needed Contact information: [address] [place] [address] [place] [address] [phone]                  Home Health: None    Equipment/Devices: None    Discharge Condition: Improved and stable.   Code Status: DNR Diet recommendation:  Discharge Diet Orders (From admission, onward)     Start     Ordered   03/10/23 0000  Diet - low sodium heart healthy        03/10/23 1441             Discharge Diagnoses:  Principal Problem:   Colitis Active Problems:   Chest pain   Iron deficiency anemia   Hypothyroidism   Dehydration   Hypocalcemia   Brief Summary: Renee [name] is a 67 y.o. 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  Some intermittent substernal chest pain, nonradiating.  No fever or chills.  Describes chest pain as sharp lightening bolt strikes for the past 1 month, worse when laying down at night.   CT abdomen and pelvis for concern of colitis involving distal descending, mid sigmoid and rectosigmoid junction.  Started on Cipro and Flagyl.  Cardiology consulted due to chest pain and elevated troponins.  Assessment and  Plan: Colitis, presumed infectious CT imaging concerning for colitis> CT abdomen showed mild wall thickening of portions of the distal descending colon, mid sigmoid colon and rectosigmoid junction compatible with colitis..   C. difficile and GI panel negative.   Treated empirically with IV Cipro and Flagyl and diet as tolerated.  Diarrhea and abdominal pain have resolved and patient states that her BMs are now back to her baseline of chronic intermittent diarrhea where she has 2-3 BMs per day, loose at times.  Tolerating diet well.   No history of eating out or eating anything unusual.  Specially at the same meal did not have symptoms.  No history of recent antibiotic use.   As communicated with ID pharmacist, plan to treat with 5 days of Cipro and Flagyl. Although CT abdomen mentions some concern for acute pyelonephritis, she never had UTI symptoms and urine microscopy does not support UTI or perinephritis. She reports that she recently had a Cologuard within the last year which was negative.  Does not follow with GI.  Defer to her PCP regarding pursuing formal colonoscopy.   Chest pain Patient with mild chest discomfort especially with eating.  Spasmodic's type of chest pain.  Denies reflux symptoms.  Apparently have been ongoing for a month. Troponin peaked at 242, likely secondary to demand ischemia.  HS Troponin then down trended to 88 > 70. Cardiology consultation and follow-up appreciated.  They indicate that the echo  and CCTA essentially are normal, they cleared her for discharge home and outpatient follow-up with them as needed.   Iron deficiency anemia Anemia panel concerning for iron deficiency.  Cologuard done in the past was unremarkable.  See discussion regarding formal colonoscopy as noted above Anemia panel: Iron 21, TIBC 486, saturation ratio 4, ferritin 6, folate 22.7, B12: 541 and absolute reticulocyte count 58.5. -Start her on iron supplement, continue -Outpatient follow-up  with repeat CBC.   Hypothyroidism -Patient reports that she had been on 250 mcg Synthroid up to a year ago, since then her PCP has gradually reduced her to 250 mcg 6 days a week and she does not take any Synthroid on 1 day of the week. Despite the current abnormal TFTs, patient is clinically euthyroid.  Advised her to closely follow-up with her PCP early next week and she may need further reduction of her Synthroid dose. TSH 0.282, T3: 103, free T4: 1.38.   Dehydration Resolved after IV fluids.  Tolerating diet.   Hypocalcemia Received IV calcium in ED On correcting mildly low serum calcium to low serum albumin yesterday, serum calcium corrects to 9.2. Outpatient follow-up.  No symptoms.  Microscopic hematuria: No UTI symptoms. Urine microscopy showed 6-10 RBC per hpf.  CT abdomen results as noted above. Recommend repeating urine microscopy in 2 to 3 weeks and if this persists, consider further evaluation including a urology consultation.  Consultations: Cardiology  Procedures: None   Discharge Instructions  Discharge Instructions     Call MD for:   Complete by: As directed    Recurrent or worsening diarrhea.   Call MD for:  difficulty breathing, headache or visual disturbances   Complete by: As directed    Call MD for:  extreme fatigue   Complete by: As directed    Call MD for:  persistant dizziness or light-headedness   Complete by: As directed    Call MD for:  persistant nausea and vomiting   Complete by: As directed    Call MD for:  severe uncontrolled pain   Complete by: As directed    Call MD for:  temperature >100.4   Complete by: As directed    Diet - low sodium heart healthy   Complete by: As directed    Increase activity slowly   Complete by: As directed         Medication List     TAKE these medications    buPROPion 150 MG 24 hr tablet Commonly known as: WELLBUTRIN XL Take 1 tablet (150 mg total) by mouth at bedtime.   ciprofloxacin 500 MG  tablet Commonly known as: Cipro Take 1 tablet (500 mg total) by mouth 2 (two) times daily for 5 days.   estrogen (conjugated)-medroxyprogesterone 0.45-1.5 MG tablet Commonly known as: PREMPRO Take 1 tablet by mouth daily.   Fe Fum-Vit C-Vit B12-FA Caps capsule Commonly known as: TRIGELS-F FORTE Take 1 capsule by mouth daily after breakfast. Start taking on: March 11, 2023   levothyroxine 200 MCG tablet Commonly known as: SYNTHROID Take 1 tablet (200 mcg total) by mouth See admin instructions. 200 mcg every morning before breakfast except none on Tuesday.   levothyroxine 50 MCG tablet Commonly known as: SYNTHROID Take 1 tablet (50 mcg total) by mouth daily before breakfast.   metroNIDAZOLE 500 MG tablet Commonly known as: Flagyl Take 1 tablet (500 mg total) by mouth 2 (two) times daily for 5 days.   naproxen sodium 220 MG tablet Commonly known as: ALEVE Take 440  mg by mouth daily as needed (arthritis pain).   PARoxetine 20 MG tablet Commonly known as: PAXIL Take 2 tablets (40 mg total) by mouth at bedtime.   vitamin B-12 500 MCG tablet Commonly known as: CYANOCOBALAMIN Take 500 mcg by mouth daily.   Vitamin D3 125 MCG (5000 UT) Caps Take 5,000 Units by mouth every Monday, Wednesday, and Friday.       Allergies  Allergen Reactions   Erythromycin Palpitations      Procedures/Studies: CT CORONARY MORPH W/CTA COR W/SCORE W/CA W/CM &/OR WO/CM  Result Date: 03/10/2023 HISTORY: Chest pain/anginal equiv, ECGs or troponins abnormal EXAM: Cardiac/Coronary  CT TECHNIQUE: The patient was scanned on a [employer]. PROTOCOL: A 120 kV prospective scan was triggered in the descending thoracic aorta at 111 HU's. Axial non-contrast 3 mm slices were carried out through the heart. The data set was analyzed on a dedicated work station and scored using the Agatston method. Gantry rotation speed was 250 msecs and collimation was .6 mm. Beta blockade and 0.8 mg of sl NTG was  given. The 3D data set was reconstructed in 5% intervals of the 35-75 % of the R-R cycle. Systolic and diastolic phases were analyzed on a dedicated work station using MPR, MIP and VRT modes. The patient received contrast: 80mL OMNIPAQUE IOHEXOL 350 MG/ML SOLN. FINDINGS: Image quality: Good Noise artifact is: Limited Coronary calcium score is 5, which places the patient in the 52nd percentile for age and sex matched control. Coronary arteries: Normal coronary origins.  Right dominance. Right Coronary Artery: Mild atherosclerotic plaque in the proximal and distal RCA, 25-49%. Left Main Coronary Artery: No detectable plaque or stenosis. Left Anterior Descending Coronary Artery: Minimal mixed atherosclerotic plaque in the proximal and mid LAD, <25% stenosis. Mild mixed atherosclerotic plaque in the proximal second diagonal artery, 25-49% stenosis. Left Circumflex Artery: Small caliber LCx with minimal scattered plaque, <25% stenosis. Aorta: Normal size, 31 mm at the mid ascending aorta (level of the PA bifurcation) measured double oblique. Aortic Valve: No calcifications. Other findings: Normal pulmonary vein drainage into the left atrium. Normal left atrial appendage without thrombus. Normal size of the pulmonary artery. Please see separate report from [facility] for non-cardiac findings. IMPRESSION: 1.  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 2. Total plaque volume 12 mm3 which is 13th percentile for age- and sex-matched controls (calcified plaque 1 mm3; non-calcified plaque 11 mm3). TPV is mild. 3. Coronary calcium score of 5. This was 52nd percentile for age and sex matched control. 4. Normal coronary origins with right dominance. RECOMMENDATIONS: CAD-RADS 2. Mild non-obstructive CAD (25-49%). Consider non-atherosclerotic causes of chest pain. Consider preventive therapy and risk factor modification. Electronically Signed   By: Weston Brass M.D.   On: 03/10/2023 12:11   ECHOCARDIOGRAM  COMPLETE  Result Date: 03/09/2023    ECHOCARDIOGRAM REPORT   Patient Name:   Renee [name] Date of Exam: 03/09/2023 Medical Rec #:  [id number]       Height:       62.0 in Accession #:    [id number]      Weight:       162.0 lb Date of Birth:  [birth year]        BSA:          1.748 m Patient Age:    67 years        BP:           138/73 mmHg Patient Gender: F  HR:           81 bpm. Exam Location:  Inpatient Procedure: 2D Echo, Color Doppler and Cardiac Doppler Indications:    chest pain  History:        Patient has no prior history of Echocardiogram examinations.  Sonographer:    Delcie Roch RDCS Referring Phys: [id number] ANASTASSIA DOUTOVA IMPRESSIONS  1. Left ventricular ejection fraction, by estimation, is 60 to 65%. The left ventricle has normal function. The left ventricle has no regional wall motion abnormalities. Left ventricular diastolic parameters were normal.  2. Right ventricular systolic function is normal. The right ventricular size is normal. There is normal pulmonary artery systolic pressure.  3. The mitral valve is normal in structure. No evidence of mitral valve regurgitation. No evidence of mitral stenosis.  4. The aortic valve is normal in structure. Aortic valve regurgitation is not visualized. No aortic stenosis is present.  5. The inferior vena cava is normal in size with greater than 50% respiratory variability, suggesting right atrial pressure of 3 mmHg. FINDINGS  Left Ventricle: Left ventricular ejection fraction, by estimation, is 60 to 65%. The left ventricle has normal function. The left ventricle has no regional wall motion abnormalities. The left ventricular internal cavity size was normal in size. There is  no left ventricular hypertrophy. Left ventricular diastolic parameters were normal. Normal left ventricular filling pressure. Right Ventricle: The right ventricular size is normal. No increase in right ventricular wall thickness. Right ventricular systolic function is  normal. There is normal pulmonary artery systolic pressure. The tricuspid regurgitant velocity is 2.08 m/s, and  with an assumed right atrial pressure of 3 mmHg, the estimated right ventricular systolic pressure is 20.3 mmHg. Left Atrium: Left atrial size was normal in size. Right Atrium: Right atrial size was normal in size. Pericardium: There is no evidence of pericardial effusion. Mitral Valve: The mitral valve is normal in structure. No evidence of mitral valve regurgitation. No evidence of mitral valve stenosis. Tricuspid Valve: The tricuspid valve is normal in structure. Tricuspid valve regurgitation is trivial. No evidence of tricuspid stenosis. Aortic Valve: The aortic valve is normal in structure. Aortic valve regurgitation is not visualized. No aortic stenosis is present. Pulmonic Valve: The pulmonic valve was normal in structure. Pulmonic valve regurgitation is not visualized. No evidence of pulmonic stenosis. Aorta: The aortic root is normal in size and structure. Venous: The inferior vena cava is normal in size with greater than 50% respiratory variability, suggesting right atrial pressure of 3 mmHg. IAS/Shunts: No atrial level shunt detected by color flow Doppler.  LEFT VENTRICLE PLAX 2D LVIDd:         4.30 cm   Diastology LVIDs:         2.30 cm   LV e' medial:    9.36 cm/s LV PW:         0.90 cm   LV E/e' medial:  9.0 LV IVS:        0.80 cm   LV e' lateral:   12.90 cm/s LVOT diam:     1.80 cm   LV E/e' lateral: 6.5 LV SV:         58 LV SV Index:   33 LVOT Area:     2.54 cm  RIGHT VENTRICLE             IVC RV Basal diam:  2.50 cm     IVC diam: 1.70 cm RV S prime:     12.50 cm/s TAPSE (M-mode): 2.0  cm LEFT ATRIUM             Index        RIGHT ATRIUM           Index LA diam:        3.80 cm 2.17 cm/m   RA Area:     11.70 cm LA Vol (A2C):   42.5 ml 24.31 ml/m  RA Volume:   24.00 ml  13.73 ml/m LA Vol (A4C):   56.6 ml 32.38 ml/m LA Biplane Vol: 50.3 ml 28.77 ml/m  AORTIC VALVE LVOT Vmax:   116.00  cm/s LVOT Vmean:  79.800 cm/s LVOT VTI:    0.229 m  AORTA Ao Root diam: 2.50 cm Ao Asc diam:  3.10 cm MITRAL VALVE               TRICUSPID VALVE MV Area (PHT): 4.80 cm    TR Peak grad:   17.3 mmHg MV Decel Time: 158 msec    TR Vmax:        208.00 cm/s MV E velocity: 83.90 cm/s MV A velocity: 85.90 cm/s  SHUNTS MV E/A ratio:  0.98        Systemic VTI:  0.23 m                            Systemic Diam: 1.80 cm Armanda Magic MD Electronically signed by Armanda Magic MD Signature Date/Time: 03/09/2023/4:27:39 PM    Final    CT ABDOMEN PELVIS W CONTRAST  Result Date: 03/08/2023 CLINICAL DATA:  Left lower quadrant pain. Vomiting. Diarrhea. Lower abdominal pain radiating to the left flank and back. EXAM: CT ABDOMEN AND PELVIS WITH CONTRAST TECHNIQUE: Multidetector CT imaging of the abdomen and pelvis was performed using the standard protocol following bolus administration of intravenous contrast. RADIATION DOSE REDUCTION: This exam was performed according to the departmental dose-optimization program which includes automated exposure control, adjustment of the mA and/or kV according to patient size and/or use of iterative reconstruction technique. CONTRAST:  75mL OMNIPAQUE IOHEXOL 350 MG/ML SOLN COMPARISON:  None Available. FINDINGS: Lower chest: No acute abnormality.  Heart size is normal. Hepatobiliary: Smooth liver contours. No focal liver lesion is seen. Status post cholecystectomy. The common bile duct measures up to approximately 10 mm in caliber, mildly enlarged and favored to be secondary to postsurgical reservoir effect. Trace central intrahepatic biliary ductal dilatation is also likely postsurgical from prior cholecystectomy. Pancreas: Unremarkable. No pancreatic ductal dilatation or surrounding inflammatory changes. Spleen: Normal in size without focal abnormality. Tiny normal variant splenule inferior to the spleen. Adrenals/Urinary Tract: Normal bilateral adrenals. The kidneys are symmetric in size without  hydronephrosis a subtle region of mild hypoperfusion within the lateral aspect of the right renal midpole (axial series 2, image 37 and coronal series 6, image 63) may represent mild prominence of the regional calyx due to mildly thinned underlying cortex, however this hypoperfusion mildly persists into the delayed phase images (axial series 8, image 16), and may represent mild acute pyelonephritis. Recommend correlation with urinalysis. There is symmetric excretion of contrast into the proximal ureters on the delayed phase images. Within limitations of streak artifact from total right hip arthroplasty hardware, no urinary bladder wall thickening is seen. Stomach/Bowel: There is mild wall thickening of portions of the distal descending colon (coronal images 70 through 77 and axial series 3 images 44 through 63) separate portions of a mid sigmoid colon (coronal image 93 and axial series 3  images 66 through 70) and the a rectosigmoid junction (axial images 60 through 75 and coronal images 33 through 50). This is compatible with colitis. No high-grade diverticula are seen throughout all the regions of this wall thickening and suspected inflammatory change, however there is a small diverticulum seen at the lateral aspect of the distal descending colon that is included within the regions of colonic wall thickening (axial series 3, image 54 and coronal series 6, image 74). There is fluid within the terminal ileum but no wall thickening to suggest inflammatory change. The appendix is not confidently identified, however no inflammatory changes are seen around the cecum to indicate secondary signs of acute appendicitis. Postsurgical changes are seen of a gastric lap band which appears grossly in appropriate position. Associated left anterior abdominal wall port also appears in appropriate position. The there are surgical clips around the stomach and surgical suture near the gastroesophageal junction above the gastric band.  Additional surgical suture is seen within the region of the mid duodenum, and the more distal duodenum does not appear to cross the midline and likely due to postsurgical change. No dilated loops of small bowel are seen to indicate bowel obstruction. Vascular/Lymphatic: No significant vascular findings are present. No enlarged abdominal or pelvic lymph nodes. Mild atherosclerotic calcifications. Reproductive: The uterus is present.  No gross adnexal mass is seen. Other: No ventral abdominal wall hernia. No free air or free fluid is seen within the abdomen or pelvis. Musculoskeletal: Status post total right hip arthroplasty with associated streak artifact. Minimal dextrocurvature centered at L3. Severe left L3-4 and moderate to severe right L4-5 degenerative disc and endplate changes. IMPRESSION: 1. Mild wall thickening of portions of the distal descending colon, mid sigmoid colon, and rectosigmoid junction, compatible with colitis. There is a single small diverticulum in the region of a distal descending colon wall thickening inflammatory change. Otherwise, no significant diverticulitis. 2. Subtle region of mild low-density within the lateral aspect of the right renal midpole may represent artifact or mild prominence of the regional calyx due to mildly thinned underlying cortex. However, this low-density mildly persists into the delayed phase images possibly representing mild hypoperfusion, and may represent mild acute pyelonephritis. Recommend correlation with urinalysis. 3. Postsurgical changes are seen of a gastric lap band which appears grossly in appropriate position. No evidence of bowel obstruction. Electronically Signed   By: Neita Garnet M.D.   On: 03/08/2023 17:55   DG Chest 2 View  Result Date: 03/08/2023 CLINICAL DATA:  Chest pain. EXAM: CHEST - 2 VIEW COMPARISON:  Chest radiographs 04/20/2017 FINDINGS: Cardiac silhouette and mediastinal contours are within limits. Mildly decreased lung volumes. The  lungs are clear. No pleural effusion pneumothorax. Mild multilevel degenerative disc changes of the thoracic spine. IMPRESSION: No active cardiopulmonary disease. Electronically Signed   By: Neita Garnet M.D.   On: 03/08/2023 17:22      Subjective: Seen this afternoon along with spouse at bedside.  Had a loose BM approximately an hour ago and a total of 3 BMs in the last 24 hours which is her norm.  No abdominal pain.  Tolerating diet.  Intermittent minimal spasm type chest pains, no shortness of breath.  Per nursing, no acute issues reported.  Discharge Exam:  Vitals:   03/09/23 2213 03/10/23 0122 03/10/23 0642 03/10/23 0644  BP: 132/67 130/70 (!) 73/47 (!) 150/78  Pulse: 72 79 72 70  Resp: 18 18 18    Temp: 98 F (36.7 C) 97.8 F (36.6 C) (!) 97.5  F (36.4 C)   TempSrc: Oral Oral Oral   SpO2: 98% 94% 98%   Weight:      Height:        General: Middle-age female, moderately built and nourished sitting up comfortably in bed without distress. Cardiovascular: S1 & S2 heard, RRR, S1/S2 +. No murmurs, rubs, gallops or clicks. No JVD or pedal edema. Respiratory: Clear to auscultation without wheezing, rhonchi or crackles. No increased work of breathing. Abdominal:  Non distended, non tender & soft. No organomegaly or masses appreciated. Normal bowel sounds heard. CNS: Alert and oriented. No focal deficits. Extremities: no edema, no cyanosis    The results of significant diagnostics from this hospitalization (including imaging, microbiology, ancillary and laboratory) are listed below for reference.     Microbiology: Recent Results (from the past 240 hour(s))  C Difficile Quick Screen w PCR reflex     Status: None   Collection Time: 03/08/23  7:27 PM   Specimen: STOOL  Result Value Ref Range Status   C Diff antigen NEGATIVE NEGATIVE Final   C Diff toxin NEGATIVE NEGATIVE Final   C Diff interpretation No C. difficile detected.  Final    Comment: Performed at [facility]  [facility], [address], [place], [place] [address]  Gastrointestinal Panel by PCR , Stool     Status: None   Collection Time: 03/09/23  3:49 PM   Specimen: Stool  Result Value Ref Range Status   Campylobacter species NOT DETECTED NOT DETECTED Final   Plesimonas shigelloides NOT DETECTED NOT DETECTED Final   Salmonella species NOT DETECTED NOT DETECTED Final   Yersinia enterocolitica NOT DETECTED NOT DETECTED Final   Vibrio species NOT DETECTED NOT DETECTED Final   Vibrio cholerae NOT DETECTED NOT DETECTED Final   Enteroaggregative E coli (EAEC) NOT DETECTED NOT DETECTED Final   Enteropathogenic E coli (EPEC) NOT DETECTED NOT DETECTED Final   Enterotoxigenic E coli (ETEC) NOT DETECTED NOT DETECTED Final   Shiga like toxin producing E coli (STEC) NOT DETECTED NOT DETECTED Final   Shigella/Enteroinvasive E coli (EIEC) NOT DETECTED NOT DETECTED Final   Cryptosporidium NOT DETECTED NOT DETECTED Final   Cyclospora cayetanensis NOT DETECTED NOT DETECTED Final   Entamoeba histolytica NOT DETECTED NOT DETECTED Final   Giardia lamblia NOT DETECTED NOT DETECTED Final   Adenovirus F40/41 NOT DETECTED NOT DETECTED Final   Astrovirus NOT DETECTED NOT DETECTED Final   Norovirus GI/GII NOT DETECTED NOT DETECTED Final   Rotavirus A NOT DETECTED NOT DETECTED Final   Sapovirus (I, II, IV, and V) NOT DETECTED NOT DETECTED Final    Comment: Performed at [facility], [address] [place], [place] [address]     Labs: CBC: Recent Labs  Lab 03/08/23 1457 03/08/23 1729 03/09/23 0346  WBC 14.2*  --  9.6  HGB 10.9* 11.2* 10.8*  HCT 35.3* 33.0* 33.5*  MCV 95.7  --  94.6  PLT 183  --  291    Basic Metabolic Panel: Recent Labs  Lab 03/08/23 1457 03/08/23 1729 03/08/23 2002 03/09/23 0346  NA 140 137 141 137  K 3.7 4.7 4.1 3.7  CL 112*  --  104 105  CO2 15*  --  23 24  GLUCOSE 177*  --  103* 99  BUN 11  --  8 7*  CREATININE 0.89  --  0.86 0.86  CALCIUM 7.2*  --  9.1 8.2*  MG  --   --   1.7 1.7  PHOS  --   --  3.5 3.3    Liver Function Tests: Recent Labs  Lab 03/08/23 1457 03/08/23 2002 03/09/23 0346  AST 28 23 20   ALT 15 21 18   ALKPHOS 57 66 57  BILITOT 0.8 0.1* 0.6  PROT 5.5* 6.3* 5.5*  ALBUMIN 2.9* 3.4* 2.9*    Thyroid function studies Recent Labs    03/08/23 2002  TSH 0.282*    Anemia work up Recent Labs    03/08/23 2002 03/09/23 0346  VITAMINB12  --  541  FOLATE  --  22.7  FERRITIN 6*  --   TIBC 486*  --   IRON 21*  --   RETICCTPCT 1.4  --     Urinalysis    Component Value Date/Time   COLORURINE YELLOW 03/08/2023 1719   APPEARANCEUR CLEAR 03/08/2023 1719   LABSPEC 1.020 03/08/2023 1719   PHURINE 5.0 03/08/2023 1719   GLUCOSEU NEGATIVE 03/08/2023 1719   GLUCOSEU NEGATIVE 05/30/2018 1032   HGBUR MODERATE (A) 03/08/2023 1719   BILIRUBINUR NEGATIVE 03/08/2023 1719   KETONESUR 5 (A) 03/08/2023 1719   PROTEINUR NEGATIVE 03/08/2023 1719   UROBILINOGEN 0.2 05/30/2018 1032   NITRITE NEGATIVE 03/08/2023 1719   LEUKOCYTESUR NEGATIVE 03/08/2023 1719    Discussed in detail with patient and spouse at bedside, updated care and answered all questions.  Time coordinating discharge: 25 minutes  SIGNED:  Marcellus Scott, MD,  FACP, [facility], [facility], [facility], [facility]   [facility] [place]      To contact the attending provider between 7A-7P or the covering provider during after hours 7P-7A, please log into the web site [email or web address] and access using universal [place] password for that web site. If you do not have the password, please call the hospital operator.

## 2023-03-11 ENCOUNTER — Other Ambulatory Visit (HOSPITAL_COMMUNITY): Payer: Self-pay

## 2023-03-12 ENCOUNTER — Encounter: Payer: Self-pay | Admitting: Hematology

## 2023-03-12 ENCOUNTER — Other Ambulatory Visit (HOSPITAL_COMMUNITY): Payer: Self-pay

## 2023-04-07 ENCOUNTER — Encounter: Payer: Self-pay | Admitting: Hematology

## 2023-04-07 ENCOUNTER — Other Ambulatory Visit: Payer: Self-pay

## 2023-04-07 ENCOUNTER — Other Ambulatory Visit (HOSPITAL_COMMUNITY): Payer: Self-pay

## 2023-05-07 ENCOUNTER — Encounter: Payer: Self-pay | Admitting: Hematology

## 2023-05-07 ENCOUNTER — Other Ambulatory Visit: Payer: Self-pay

## 2023-05-07 ENCOUNTER — Other Ambulatory Visit (HOSPITAL_COMMUNITY): Payer: Self-pay

## 2023-05-07 MED ORDER — TRIGELS-F FORTE 460-60-0.01-1 MG PO CAPS
1.0000 | ORAL_CAPSULE | Freq: Every day | ORAL | 1 refills | Status: AC
Start: 2023-05-07 — End: ?
  Filled 2023-05-07: qty 90, 90d supply, fill #0
  Filled 2023-05-07: qty 30, 30d supply, fill #0
  Filled 2023-06-15: qty 30, 30d supply, fill #1
  Filled 2023-08-05: qty 30, 30d supply, fill #2
  Filled 2023-09-16: qty 30, 30d supply, fill #3

## 2023-05-11 ENCOUNTER — Other Ambulatory Visit (HOSPITAL_COMMUNITY): Payer: Self-pay

## 2023-06-16 ENCOUNTER — Encounter: Payer: Self-pay | Admitting: Hematology

## 2023-06-16 ENCOUNTER — Other Ambulatory Visit (HOSPITAL_COMMUNITY): Payer: Self-pay

## 2023-06-18 ENCOUNTER — Other Ambulatory Visit (HOSPITAL_COMMUNITY): Payer: Self-pay

## 2023-06-21 ENCOUNTER — Encounter: Payer: Self-pay | Admitting: Hematology

## 2023-06-21 ENCOUNTER — Other Ambulatory Visit (HOSPITAL_COMMUNITY): Payer: Self-pay

## 2023-08-05 ENCOUNTER — Other Ambulatory Visit (HOSPITAL_COMMUNITY): Payer: Self-pay

## 2023-08-05 ENCOUNTER — Encounter: Payer: Self-pay | Admitting: Hematology

## 2023-09-16 ENCOUNTER — Encounter: Payer: Self-pay | Admitting: Hematology

## 2023-09-16 ENCOUNTER — Other Ambulatory Visit (HOSPITAL_COMMUNITY): Payer: Self-pay

## 2023-09-16 ENCOUNTER — Other Ambulatory Visit (HOSPITAL_BASED_OUTPATIENT_CLINIC_OR_DEPARTMENT_OTHER): Payer: Self-pay

## 2024-08-18 ENCOUNTER — Encounter (INDEPENDENT_AMBULATORY_CARE_PROVIDER_SITE_OTHER): Payer: Self-pay

## 2024-08-24 ENCOUNTER — Ambulatory Visit: Admitting: Audiologist

## 2024-09-05 ENCOUNTER — Institutional Professional Consult (permissible substitution) (INDEPENDENT_AMBULATORY_CARE_PROVIDER_SITE_OTHER): Admitting: Physician Assistant

## 2024-09-12 ENCOUNTER — Ambulatory Visit: Admitting: Audiologist
